# Patient Record
Sex: Female | Born: 1935 | Race: White | Hispanic: No | Smoking: Never smoker
Health system: Southern US, Community
[De-identification: ages and names within clinical notes are randomized; demographics above are authoritative.]

## PROBLEM LIST (undated history)

## (undated) ENCOUNTER — Ambulatory Visit (HOSPITAL_COMMUNITY): Discharge status: Absent without leave | Payer: 59

## (undated) DIAGNOSIS — I10 Essential (primary) hypertension: Secondary | ICD-10-CM

## (undated) DIAGNOSIS — D6851 Activated protein C resistance: Secondary | ICD-10-CM

## (undated) DIAGNOSIS — E119 Type 2 diabetes mellitus without complications: Secondary | ICD-10-CM

## (undated) DIAGNOSIS — E611 Iron deficiency: Secondary | ICD-10-CM

## (undated) DIAGNOSIS — N289 Disorder of kidney and ureter, unspecified: Secondary | ICD-10-CM

## (undated) HISTORY — PX: PARATHYROIDECTOMY / EXPLORATION OF PARATHYROIDS: SUR1002

## (undated) HISTORY — PX: STENT PLACEMENT VASCULAR (ARMC HX): HXRAD1737

## (undated) HISTORY — DX: Activated protein C resistance: D68.51

## (undated) HISTORY — DX: Iron deficiency: E61.1

---

## 2012-06-09 DIAGNOSIS — M8580 Other specified disorders of bone density and structure, unspecified site: Secondary | ICD-10-CM | POA: Insufficient documentation

## 2012-12-08 DIAGNOSIS — E890 Postprocedural hypothyroidism: Secondary | ICD-10-CM | POA: Insufficient documentation

## 2012-12-08 DIAGNOSIS — E892 Postprocedural hypoparathyroidism: Secondary | ICD-10-CM | POA: Insufficient documentation

## 2012-12-08 DIAGNOSIS — Z9889 Other specified postprocedural states: Secondary | ICD-10-CM | POA: Insufficient documentation

## 2013-04-26 DIAGNOSIS — E892 Postprocedural hypoparathyroidism: Secondary | ICD-10-CM | POA: Insufficient documentation

## 2015-11-30 DIAGNOSIS — N183 Chronic kidney disease, stage 3 unspecified: Secondary | ICD-10-CM | POA: Insufficient documentation

## 2016-04-25 DIAGNOSIS — E119 Type 2 diabetes mellitus without complications: Secondary | ICD-10-CM | POA: Insufficient documentation

## 2016-05-07 ENCOUNTER — Encounter (HOSPITAL_COMMUNITY): Payer: Self-pay

## 2016-05-07 ENCOUNTER — Emergency Department (HOSPITAL_COMMUNITY)
Admission: EM | Admit: 2016-05-07 | Discharge: 2016-05-07 | Disposition: A | Discharge status: Home / Self Care | Payer: Medicare Other | Attending: Emergency Medicine | Admitting: Emergency Medicine

## 2016-05-07 ENCOUNTER — Emergency Department (HOSPITAL_COMMUNITY): Payer: Medicare Other

## 2016-05-07 DIAGNOSIS — S51812A Laceration without foreign body of left forearm, initial encounter: Secondary | ICD-10-CM | POA: Insufficient documentation

## 2016-05-07 DIAGNOSIS — Y999 Unspecified external cause status: Secondary | ICD-10-CM | POA: Diagnosis not present

## 2016-05-07 DIAGNOSIS — N189 Chronic kidney disease, unspecified: Secondary | ICD-10-CM | POA: Diagnosis not present

## 2016-05-07 DIAGNOSIS — I129 Hypertensive chronic kidney disease with stage 1 through stage 4 chronic kidney disease, or unspecified chronic kidney disease: Secondary | ICD-10-CM | POA: Insufficient documentation

## 2016-05-07 DIAGNOSIS — S299XXA Unspecified injury of thorax, initial encounter: Secondary | ICD-10-CM | POA: Diagnosis present

## 2016-05-07 DIAGNOSIS — M542 Cervicalgia: Secondary | ICD-10-CM | POA: Diagnosis not present

## 2016-05-07 DIAGNOSIS — D649 Anemia, unspecified: Secondary | ICD-10-CM

## 2016-05-07 DIAGNOSIS — Z79899 Other long term (current) drug therapy: Secondary | ICD-10-CM | POA: Diagnosis not present

## 2016-05-07 DIAGNOSIS — S61412A Laceration without foreign body of left hand, initial encounter: Secondary | ICD-10-CM | POA: Insufficient documentation

## 2016-05-07 DIAGNOSIS — S2232XA Fracture of one rib, left side, initial encounter for closed fracture: Secondary | ICD-10-CM

## 2016-05-07 DIAGNOSIS — Y939 Activity, unspecified: Secondary | ICD-10-CM | POA: Diagnosis not present

## 2016-05-07 DIAGNOSIS — Z87898 Personal history of other specified conditions: Secondary | ICD-10-CM

## 2016-05-07 DIAGNOSIS — Y929 Unspecified place or not applicable: Secondary | ICD-10-CM | POA: Diagnosis not present

## 2016-05-07 DIAGNOSIS — S51811A Laceration without foreign body of right forearm, initial encounter: Secondary | ICD-10-CM | POA: Insufficient documentation

## 2016-05-07 DIAGNOSIS — W19XXXA Unspecified fall, initial encounter: Secondary | ICD-10-CM

## 2016-05-07 DIAGNOSIS — Z7984 Long term (current) use of oral hypoglycemic drugs: Secondary | ICD-10-CM | POA: Diagnosis not present

## 2016-05-07 DIAGNOSIS — E1122 Type 2 diabetes mellitus with diabetic chronic kidney disease: Secondary | ICD-10-CM | POA: Diagnosis not present

## 2016-05-07 DIAGNOSIS — W100XXA Fall (on)(from) escalator, initial encounter: Secondary | ICD-10-CM | POA: Insufficient documentation

## 2016-05-07 DIAGNOSIS — N289 Disorder of kidney and ureter, unspecified: Secondary | ICD-10-CM

## 2016-05-07 DIAGNOSIS — R195 Other fecal abnormalities: Secondary | ICD-10-CM | POA: Diagnosis not present

## 2016-05-07 HISTORY — DX: Disorder of kidney and ureter, unspecified: N28.9

## 2016-05-07 HISTORY — DX: Essential (primary) hypertension: I10

## 2016-05-07 HISTORY — DX: Type 2 diabetes mellitus without complications: E11.9

## 2016-05-07 LAB — COMPREHENSIVE METABOLIC PANEL
ALBUMIN: 3.3 g/dL — AB (ref 3.5–5.0)
ALK PHOS: 71 U/L (ref 38–126)
ALT: 10 U/L — ABNORMAL LOW (ref 14–54)
AST: 15 U/L (ref 15–41)
Anion gap: 8 (ref 5–15)
BILIRUBIN TOTAL: 0.8 mg/dL (ref 0.3–1.2)
BUN: 24 mg/dL — ABNORMAL HIGH (ref 6–20)
CALCIUM: 8.9 mg/dL (ref 8.9–10.3)
CO2: 23 mmol/L (ref 22–32)
CREATININE: 1.36 mg/dL — AB (ref 0.44–1.00)
Chloride: 109 mmol/L (ref 101–111)
GFR, EST AFRICAN AMERICAN: 40 mL/min — AB (ref >60)
GFR, EST NON AFRICAN AMERICAN: 35 mL/min — AB (ref >60)
Glucose, Bld: 131 mg/dL — ABNORMAL HIGH (ref 65–99)
Potassium: 4.2 mmol/L (ref 3.5–5.1)
Sodium: 140 mmol/L (ref 135–145)
TOTAL PROTEIN: 7.5 g/dL (ref 6.5–8.1)

## 2016-05-07 LAB — URINALYSIS, ROUTINE W REFLEX MICROSCOPIC
BILIRUBIN URINE: NEGATIVE
Glucose, UA: NEGATIVE mg/dL
Hgb urine dipstick: NEGATIVE
KETONES UR: NEGATIVE mg/dL
Leukocytes, UA: NEGATIVE
NITRITE: NEGATIVE
Protein, ur: NEGATIVE mg/dL
SPECIFIC GRAVITY, URINE: 1.025 (ref 1.005–1.030)
pH: 5.5 (ref 5.0–8.0)

## 2016-05-07 LAB — CBC WITH DIFFERENTIAL/PLATELET
BASOS PCT: 0 %
Basophils Absolute: 0 10*3/uL (ref 0.0–0.1)
EOS ABS: 0 10*3/uL (ref 0.0–0.7)
Eosinophils Relative: 1 %
HCT: 26.8 % — ABNORMAL LOW (ref 36.0–46.0)
HEMOGLOBIN: 8.2 g/dL — AB (ref 12.0–15.0)
Lymphocytes Relative: 10 %
Lymphs Abs: 0.8 10*3/uL (ref 0.7–4.0)
MCH: 24.3 pg — ABNORMAL LOW (ref 26.0–34.0)
MCHC: 30.6 g/dL (ref 30.0–36.0)
MCV: 79.3 fL (ref 78.0–100.0)
MONOS PCT: 10 %
Monocytes Absolute: 0.8 10*3/uL (ref 0.1–1.0)
NEUTROS PCT: 79 %
Neutro Abs: 6.5 10*3/uL (ref 1.7–7.7)
Platelets: 321 10*3/uL (ref 150–400)
RBC: 3.38 MIL/uL — ABNORMAL LOW (ref 3.87–5.11)
RDW: 16.1 % — ABNORMAL HIGH (ref 11.5–15.5)
WBC: 8.1 10*3/uL (ref 4.0–10.5)

## 2016-05-07 LAB — POC OCCULT BLOOD, ED: FECAL OCCULT BLD: POSITIVE — AB

## 2016-05-07 MED ORDER — FERROUS SULFATE 325 (65 FE) MG PO TABS: 325.0000 mg | ORAL_TABLET | Freq: Two times a day (BID) | ORAL | 0 refills | Status: AC

## 2016-05-07 NOTE — ED Triage Notes (Addendum)
Patient reports of falling down escalator 1 weeks ago from top to bottom. Complains of left hip/lef rib, neck and back pain. Family reports of recent fevers. Denies loc. Patient is on Plavix. Was not seen after fall per patient. Pt a&ox4.

## 2016-05-07 NOTE — ED Provider Notes (Signed)
[facility] DEPT Provider Note   CSN: [id number] Arrival date & time: 05/07/16  1243  By signing my name below, I, Placido SouLogan Joldersma, attest that this documentation has been prepared under the direction and in the presence of Jacalyn LefevreJulie Kalijah Westfall, MD. Electronically Signed: Placido SouLogan Joldersma, ED Scribe. 05/07/16. 1:52 PM.   History   Chief Complaint Chief Complaint  Patient presents with  . Fall    HPI HPI Comments: Vickie Willis is a 82 y.o. female who presents to the Emergency Department complaining of a fall that occurred 1 week ago. Pt states that she tripped on an escalator and fell down the stairs to the floor below. Pt reports associated worsening pain to her left ribs, left hip, lower back, feet and neck and and multiple healing skin tears to her bilateral forearms. Pt also complains of mild SOB which worsens with movement. She confirms she has been ambulatory since the accident. Her relative states that she began experiencing a fever two days ago (TMAX 101 last night and 98.5 in triage) and this in addition to her pain is the cause of their concern. Pt is on Plavix. She denies abd pain and dysuria.    The history is provided by the patient and a relative. No language interpreter was used.    Past Medical History:  Diagnosis Date  . Diabetes mellitus without complication (HCC)   . Hypertension   . Renal disorder     There are no active problems to display for this patient.   Past Surgical History:  Procedure Laterality Date  . PARATHYROIDECTOMY / EXPLORATION OF PARATHYROIDS    . STENT PLACEMENT VASCULAR ([facility] HX)      OB History    No data available       Home Medications    Prior to Admission medications   Medication Sig Start Date End Date Taking? Authorizing Provider  calcitRIOL (ROCALTROL) 0.5 MCG capsule Take 0.5 mcg by mouth daily.   Yes Historical Provider, MD  cetirizine (ZYRTEC) 10 MG tablet Take 10 mg by mouth daily.   Yes Historical Provider, MD    clopidogrel (PLAVIX) 75 MG tablet Take 75 mg by mouth daily.   Yes Historical Provider, MD  Coconut Oil 1000 MG CAPS Take by mouth 2 (two) times daily.   Yes Historical Provider, MD  doxazosin (CARDURA) 1 MG tablet Take 1 mg by mouth daily.   Yes Historical Provider, MD  esomeprazole (NEXIUM) 40 MG capsule Take 40 mg by mouth daily.    Yes Historical Provider, MD  gabapentin (NEURONTIN) 100 MG capsule Take 100 mg by mouth 2 (two) times daily.   Yes Historical Provider, MD  Garlic 1000 MG CAPS Take 1,000 mg by mouth daily.    Yes Historical Provider, MD  levothyroxine (SYNTHROID, LEVOTHROID) 100 MCG tablet Take 100 mcg by mouth daily before breakfast.   Yes Historical Provider, MD  lisinopril (PRINIVIL,ZESTRIL) 20 MG tablet Take 20 mg by mouth daily.   Yes Historical Provider, MD  NIFEdipine (PROCARDIA XL/ADALAT-CC) 60 MG 24 hr tablet Take 60 mg by mouth daily.   Yes Historical Provider, MD  sertraline (ZOLOFT) 100 MG tablet Take 50 mg by mouth daily.   Yes Historical Provider, MD  Vitamin D, Ergocalciferol, (DRISDOL) 50000 units CAPS capsule Take 50,000 Units by mouth every 7 (seven) days. wednesday   Yes Historical Provider, MD  glimepiride (AMARYL) 1 MG tablet Take 1 mg by mouth daily as needed. High blood sugar 04/25/16   Historical Provider, MD  HYDROcodone-acetaminophen (  NORCO) 7.5-325 MG tablet Take 1 tablet by mouth every 4 (four) hours as needed. 03/24/16   Historical Provider, MD    Family History No family history on file.  Social History Social History  Substance Use Topics  . Smoking status: Never Smoker  . Smokeless tobacco: Never Used  . Alcohol use No     Allergies   Penicillins   Review of Systems Review of Systems  Constitutional: Positive for fever.  Respiratory: Positive for shortness of breath.   Gastrointestinal: Negative for abdominal pain.  Genitourinary: Negative for dysuria.  Musculoskeletal: Positive for arthralgias, back pain, myalgias and neck pain.   Skin: Positive for color change and wound.  All other systems reviewed and are negative.  Physical Exam Updated Vital Signs BP 146/73   Pulse 77   Temp 98.5 F (36.9 C) (Oral)   Resp 18   Ht 5\' 7"  (1.702 m)   Wt 193 lb (87.5 kg)   SpO2 99%   BMI 30.23 kg/m   Physical Exam  Constitutional: She is oriented to person, place, and time. She appears well-developed and well-nourished.  HENT:  Head: Normocephalic and atraumatic.  Eyes: EOM are normal.  Neck: Normal range of motion.  Cardiovascular: Normal rate.   Pulmonary/Chest: Effort normal. No respiratory distress.  Abdominal: Soft.  Genitourinary: Rectal exam shows guaiac positive stool.  Musculoskeletal: Normal range of motion. She exhibits tenderness.  TTP to the left ribs. No midline spine tenderness.   Neurological: She is alert and oriented to person, place, and time.  Skin: Skin is warm and dry.  Skin tears on bilateral forearms and left hand. Bruising to the left upper arm. Bruising and healing wounds to the left ribs.   Psychiatric: She has a normal mood and affect.  Nursing note and vitals reviewed.  ED Treatments / Results  Labs (all labs ordered are listed, but only abnormal results are displayed) Labs Reviewed  COMPREHENSIVE METABOLIC PANEL - Abnormal; Notable for the following:       Result Value   Glucose, Bld 131 (*)    BUN 24 (*)    Creatinine, Ser 1.36 (*)    Albumin 3.3 (*)    ALT 10 (*)    GFR calc non Af Amer 35 (*)    GFR calc Af Amer 40 (*)    All other components within normal limits  CBC WITH DIFFERENTIAL/PLATELET - Abnormal; Notable for the following:    RBC 3.38 (*)    Hemoglobin 8.2 (*)    HCT 26.8 (*)    MCH 24.3 (*)    RDW 16.1 (*)    All other components within normal limits  URINALYSIS, ROUTINE W REFLEX MICROSCOPIC (NOT AT [facility])  POC OCCULT BLOOD, ED    EKG  EKG Interpretation None       Radiology Dg Ribs Unilateral W/chest Left  Result Date: 05/07/2016 CLINICAL DATA:   Status post fall down S-2 later 10 days ago. Persistent anterior left lower rib discomfort. EXAM: LEFT RIBS AND CHEST - 3+ VIEW COMPARISON:  None in PACs FINDINGS: The lungs are well-expanded and clear. The heart and pulmonary vascularity are normal. The mediastinum is normal in width. There is gentle dextrocurvature centered in the mid thoracic spine. There is calcification in the wall of the aortic arch. The patient has sustained an acute fracture of the lateral aspect of the left 6th rib. There degenerative changes of the AC joints. IMPRESSION: Acute lateral left sixth rib fracture. No pneumothorax, pleural effusion,  or pulmonary contusion. Aortic atherosclerosis. Electronically Signed   By: David  [place] M.D.   On: 05/07/2016 13:37   Dg Hip Unilat With Pelvis 2-3 Views Left  Result Date: 05/07/2016 CLINICAL DATA:  Status post fall down S2 later 10 days ago. Persistent back and left hip discomfort. EXAM: DG HIP (WITH OR WITHOUT PELVIS) 2-3V LEFT COMPARISON:  None in PACs FINDINGS: The bones are subjectively osteopenic. The bony pelvis exhibits no acute abnormality. Probable old fracture of the inferior pubic ramus on the left is observed. There is mild symmetric narrowing of the left hip joint space. The femoral head, neck, intertrochanteric, and subtrochanteric regions are normal. IMPRESSION: No definite acute left hip fracture. Irregularity of the inferior pubic ramus consistent with previous fracture that has healed with deformity. No definite acute pelvic fracture is observed. If there are strong clinical concerns of an occult fracture, CT scanning or MRI would be useful next imaging steps. Electronically Signed   By: David  [place] M.D.   On: 05/07/2016 13:40    Procedures Procedures  DIAGNOSTIC STUDIES: Oxygen Saturation is 99% on RA, normal by my interpretation.    COORDINATION OF CARE: 1:48 PM Discussed next steps with pt. Pt verbalized understanding and is agreeable with the plan.     Medications Ordered in ED Medications - No data to display   Initial Impression / Assessment and Plan / ED Course  I have reviewed the triage vital signs and the nursing notes.  Pertinent labs & imaging results that were available during my care of the patient were reviewed by me and considered in my medical decision making (see chart for details).  Clinical Course   I personally performed the services described in this documentation, which was scribed in my presence. The recorded information has been reviewed and is accurate.  Pt's last hgb was 9 on 8/22 in [place] (pt is visiting her daughter here in [place]).    Because the pt's hemoccult was + and her hgb is lower than it was, I recommended admission.  Pt refuses this.   She knows to return if sx worsen.  She does not take iron at home, and will be started on it.  Pt has pain meds at home and does not want anything here or for home.  The pt is given an incentive spirometer for her rib fracture.  She had a fever at home, but no fever here and no sign of infection.  Final Clinical Impressions(s) / ED Diagnoses   Final diagnoses:  Fall, initial encounter  Rib fracture, left, closed, initial encounter  Anemia, unspecified anemia type  CRI (chronic renal insufficiency), unspecified stage  Guaiac positive stools  History of fever    New Prescriptions New Prescriptions   No medications on file     Jacalyn Lefevre, MD 05/07/16 [phone]

## 2016-06-09 ENCOUNTER — Ambulatory Visit: Payer: Self-pay | Admitting: Family Medicine

## 2016-06-09 ENCOUNTER — Ambulatory Visit: Payer: Medicare Other | Admitting: Family Medicine

## 2016-06-17 ENCOUNTER — Telehealth: Payer: Self-pay | Admitting: Family Medicine

## 2016-06-17 NOTE — Telephone Encounter (Signed)
Pt was advised that she should come in today for swollen, discolored finger. Pt states that she does not want to get out of the bed today and would rather an appointment for tomorrow. Told pt about the possibility of infection and she insisted on not coming in today.

## 2016-06-18 ENCOUNTER — Ambulatory Visit (INDEPENDENT_AMBULATORY_CARE_PROVIDER_SITE_OTHER): Payer: Medicare Other | Admitting: Family Medicine

## 2016-06-18 ENCOUNTER — Encounter: Payer: Self-pay | Admitting: Family Medicine

## 2016-06-18 VITALS — BP 104/70 | Ht 67.0 in | Wt 194.0 lb

## 2016-06-18 DIAGNOSIS — M109 Gout, unspecified: Secondary | ICD-10-CM | POA: Diagnosis not present

## 2016-06-18 DIAGNOSIS — M791 Myalgia, unspecified site: Secondary | ICD-10-CM

## 2016-06-18 DIAGNOSIS — M79644 Pain in right finger(s): Secondary | ICD-10-CM

## 2016-06-18 DIAGNOSIS — M2550 Pain in unspecified joint: Secondary | ICD-10-CM | POA: Diagnosis not present

## 2016-06-18 DIAGNOSIS — E119 Type 2 diabetes mellitus without complications: Secondary | ICD-10-CM | POA: Diagnosis not present

## 2016-06-18 MED ORDER — PREDNISONE 20 MG PO TABS: ORAL_TABLET | ORAL | 0 refills | Status: DC

## 2016-06-18 MED ORDER — COLCHICINE 0.6 MG PO TABS: 0.6000 mg | ORAL_TABLET | Freq: Two times a day (BID) | ORAL | 1 refills | Status: DC

## 2016-06-18 NOTE — Progress Notes (Signed)
   Subjective:    Patient ID: Vickie Willis, female    DOB: 24-Jun-1931, 82 y.o.   MRN: [id number]  HPI Patient in today for edema and pain to right hand. Onset 3 days ago. States has not tried any treatments.  This is a new patient comes in today having some problems. She lives in [place] but she presents here because she is staying with family. She fell and broke some ribs. Recently she describes pain in her index finger she also relates some stiffness and achiness in her joints as well as the muscles around her neck and also feels at times like she can't quite get up and move around as well as she used to she denies numbness tingling chills. Denies chest pressure tightness pain. Her past medical history was reviewed. Patient would also like to discuss antiinflammatory medications. Review of Systems Currently now denies chest tightness pressure pain shortness breath nausea vomiting diarrhea. She is treated for multiple medical illnesses these were reviewed with patient. She does have history of heart stents and is on Plavix.    Objective:   Physical Exam Lungs are clear no crackles chest wall tender where she had a fractured rib heart is regular. HEENT benign no obvious swelling in the joints is set for the index finger which is somewhat swollen slightly red and tender in the joints       Assessment & Plan:  Possible gout check uric acid level Possible polymyalgia rheumatica check lab work Short course of prednisone this will cause sugars to go up Diabetes continue current measures watch diet closely I do not find evidence of infection May need referral to rheumatology Wait the results of the tests complex patient 30 minutes spent with patient new patient-greater in half in discussion

## 2016-06-19 ENCOUNTER — Other Ambulatory Visit: Payer: Self-pay

## 2016-06-19 LAB — URIC ACID: URIC ACID: 5.3 mg/dL (ref 2.5–7.1)

## 2016-06-19 LAB — CBC WITH DIFFERENTIAL/PLATELET
BASOS ABS: 0.1 10*3/uL (ref 0.0–0.2)
BASOS: 1 %
EOS (ABSOLUTE): 0 10*3/uL (ref 0.0–0.4)
Eos: 0 %
HEMOGLOBIN: 8.6 g/dL — AB (ref 11.1–15.9)
Hematocrit: 27 % — ABNORMAL LOW (ref 34.0–46.6)
Immature Grans (Abs): 0.1 10*3/uL (ref 0.0–0.1)
Immature Granulocytes: 1 %
LYMPHS ABS: 1.1 10*3/uL (ref 0.7–3.1)
Lymphs: 10 %
MCH: 23.5 pg — AB (ref 26.6–33.0)
MCHC: 31.9 g/dL (ref 31.5–35.7)
MCV: 74 fL — AB (ref 79–97)
MONOCYTES: 9 %
Monocytes Absolute: 0.9 10*3/uL (ref 0.1–0.9)
NEUTROS ABS: 8.6 10*3/uL — AB (ref 1.4–7.0)
Neutrophils: 79 %
Platelets: 329 10*3/uL (ref 150–379)
RBC: 3.66 x10E6/uL — ABNORMAL LOW (ref 3.77–5.28)
RDW: 17.5 % — ABNORMAL HIGH (ref 12.3–15.4)
WBC: 10.7 10*3/uL (ref 3.4–10.8)

## 2016-06-19 LAB — HEMOGLOBIN A1C
Est. average glucose Bld gHb Est-mCnc: 140 mg/dL
Hgb A1c MFr Bld: 6.5 % — ABNORMAL HIGH (ref 4.8–5.6)

## 2016-06-19 LAB — SEDIMENTATION RATE: SED RATE: 71 mm/h — AB (ref 0–40)

## 2016-06-19 LAB — C-REACTIVE PROTEIN: CRP: 114.3 mg/L — AB (ref 0.0–4.9)

## 2016-06-19 MED ORDER — DOXYCYCLINE HYCLATE 100 MG PO TABS: 100.0000 mg | ORAL_TABLET | Freq: Two times a day (BID) | ORAL | 0 refills | Status: DC

## 2016-06-20 LAB — SPECIMEN STATUS REPORT

## 2016-06-24 ENCOUNTER — Other Ambulatory Visit: Payer: Self-pay

## 2016-06-24 DIAGNOSIS — D649 Anemia, unspecified: Secondary | ICD-10-CM

## 2016-06-26 ENCOUNTER — Ambulatory Visit (INDEPENDENT_AMBULATORY_CARE_PROVIDER_SITE_OTHER): Payer: Medicare Other | Admitting: Family Medicine

## 2016-06-26 ENCOUNTER — Encounter: Payer: Self-pay | Admitting: Family Medicine

## 2016-06-26 VITALS — BP 128/70 | Ht 67.0 in | Wt 191.4 lb

## 2016-06-26 DIAGNOSIS — D509 Iron deficiency anemia, unspecified: Secondary | ICD-10-CM

## 2016-06-26 DIAGNOSIS — M2550 Pain in unspecified joint: Secondary | ICD-10-CM | POA: Diagnosis not present

## 2016-06-26 LAB — IRON AND TIBC
IRON SATURATION: 5 % — AB (ref 15–55)
Iron: 16 ug/dL — ABNORMAL LOW (ref 27–139)
Total Iron Binding Capacity: 321 ug/dL (ref 250–450)
UIBC: 305 ug/dL (ref 118–369)

## 2016-06-26 LAB — SPECIMEN STATUS REPORT

## 2016-06-26 LAB — FERRITIN: FERRITIN: 48 ng/mL (ref 15–150)

## 2016-06-26 NOTE — Progress Notes (Signed)
   Subjective:    Patient ID: Vickie Willis, female    DOB: 1931/07/16, 82 y.o.   MRN: [id number]  HPI Patient is here today for a follow up from her last visit. Patient has recent bloodwork done and her iron levels are very low. Patient has no concerns at this time.  Long discussion held today with the patient regarding her anemia she has not seen any blood in her stools recently. She is had a thorough workup from her doctor in [place] who is done both colonoscopy endoscopy capsule study. And follows her blood work. The specialist thinks it's related to her diverticula. The patient states her finger is doing much better is no longer red or swollen. Patient's recent lab work showed elevation of C-reactive protein and sedimentation rate. Patient states she is had her arthritis addressed by her specialist but was told there really wasn't much she could take because of her kidney function Review of Systems  Constitutional: Negative for activity change, fatigue and fever.  Respiratory: Negative for cough and shortness of breath.   Cardiovascular: Negative for chest pain and leg swelling.  Neurological: Negative for headaches.       Objective:   Physical Exam  Constitutional: She appears well-nourished. No distress.  Cardiovascular: Normal rate, regular rhythm and normal heart sounds.   No murmur heard. Pulmonary/Chest: Effort normal and breath sounds normal. No respiratory distress.  Musculoskeletal: She exhibits no edema.  Lymphadenopathy:    She has no cervical adenopathy.  Neurological: She is alert. She exhibits normal muscle tone.  Psychiatric: Her behavior is normal.  Vitals reviewed.   Patient will do Hemoccult cards      Assessment & Plan:  Resolved finger infection Significant arthritis follow-up with specialist in [place] Significant iron deficient anemia-follow-up with specialists in [place]. Consider iron infusion. Repeat CBC in 3-4 weeks.

## 2016-06-30 ENCOUNTER — Encounter: Payer: Self-pay | Admitting: Family Medicine

## 2016-06-30 ENCOUNTER — Telehealth: Payer: Self-pay | Admitting: Family Medicine

## 2016-06-30 DIAGNOSIS — D509 Iron deficiency anemia, unspecified: Secondary | ICD-10-CM

## 2016-06-30 DIAGNOSIS — M353 Polymyalgia rheumatica: Secondary | ICD-10-CM

## 2016-06-30 MED ORDER — PREDNISONE 10 MG PO TABS: ORAL_TABLET | ORAL | 0 refills | Status: AC

## 2016-06-30 NOTE — Telephone Encounter (Signed)
Requesting a nurse to call her back.  Vickie Willis originally came in here for her finger swelling.  She finished the medication and when she came in last Thursday she was feeling fine.  Now, she woke up this morning and she cant hardly walk and her finger is swollen again.  Daughter is wanting to know if we can call in another round of medication?

## 2016-06-30 NOTE — Telephone Encounter (Signed)
Notified daughter that patient is having polymyalgia rheumatica which is a autoimmune illness that may well need ongoing prednisone. Recommend prednisone 10 mg tablets, take 4 daily for the next 4 days then 3 daily for 4 days then 2 a day for 4 days then 1 a day for 4 days. Dr. Lorin PicketScott also believes patient would benefit from seeing a rheumatologist. [place]. Med sent to pharmacy. Daughter will discuss referral with patient and if she agrees they will call back to have that initiated. Finger is just swollen not red or tender per daughter.

## 2016-06-30 NOTE — Telephone Encounter (Signed)
I believe she is having polymyalgia rheumatica which is a autoimmune illness that may well need ongoing prednisone. I recommend prednisone 10 mg tablets, take 4 daily for the next 4 days then 3 daily for 4 days then 2 a day for 4 days then 1 a day for 4 days. I also believe patient would benefit from seeing a rheumatologist. We could set [place] up with [place] specialist in [place] if family willing. Also inquire with family is the finger just more swollen like it was last time or is it red and tender?

## 2016-06-30 NOTE — Telephone Encounter (Signed)
Daughter Almira Coaster(Gina) states that patient's finger is swollen again, she is having a hard time getting around and having muscle aches. No other symptoms. Wants to know does she need another round of medication?

## 2016-07-01 NOTE — Telephone Encounter (Signed)
Please give referral to Dr. Galen ManilaPenland for iron deficient anemia.

## 2016-07-08 ENCOUNTER — Encounter: Payer: Self-pay | Admitting: Family Medicine

## 2016-07-12 ENCOUNTER — Emergency Department (HOSPITAL_COMMUNITY)
Admission: EM | Admit: 2016-07-12 | Discharge: 2016-07-12 | Disposition: A | Discharge status: Home / Self Care | Payer: Medicare Other | Attending: Emergency Medicine | Admitting: Emergency Medicine

## 2016-07-12 ENCOUNTER — Encounter (HOSPITAL_COMMUNITY): Payer: Self-pay | Admitting: *Deleted

## 2016-07-12 ENCOUNTER — Ambulatory Visit (HOSPITAL_COMMUNITY)
Admission: EM | Admit: 2016-07-12 | Discharge: 2016-07-12 | Disposition: A | Discharge status: Home / Self Care | Payer: Medicare Other | Attending: Emergency Medicine | Admitting: Emergency Medicine

## 2016-07-12 ENCOUNTER — Ambulatory Visit (INDEPENDENT_AMBULATORY_CARE_PROVIDER_SITE_OTHER): Payer: Medicare Other

## 2016-07-12 ENCOUNTER — Encounter (HOSPITAL_COMMUNITY): Payer: Self-pay | Admitting: Emergency Medicine

## 2016-07-12 DIAGNOSIS — I509 Heart failure, unspecified: Secondary | ICD-10-CM | POA: Insufficient documentation

## 2016-07-12 DIAGNOSIS — J189 Pneumonia, unspecified organism: Secondary | ICD-10-CM

## 2016-07-12 DIAGNOSIS — R0600 Dyspnea, unspecified: Secondary | ICD-10-CM

## 2016-07-12 DIAGNOSIS — I110 Hypertensive heart disease with heart failure: Secondary | ICD-10-CM | POA: Diagnosis not present

## 2016-07-12 DIAGNOSIS — I4891 Unspecified atrial fibrillation: Secondary | ICD-10-CM

## 2016-07-12 DIAGNOSIS — R0602 Shortness of breath: Secondary | ICD-10-CM | POA: Diagnosis present

## 2016-07-12 DIAGNOSIS — Z79899 Other long term (current) drug therapy: Secondary | ICD-10-CM | POA: Diagnosis not present

## 2016-07-12 LAB — CBC
HEMATOCRIT: 29.5 % — AB (ref 36.0–46.0)
HEMOGLOBIN: 8.7 g/dL — AB (ref 12.0–15.0)
MCH: 23.1 pg — AB (ref 26.0–34.0)
MCHC: 29.5 g/dL — AB (ref 30.0–36.0)
MCV: 78.2 fL (ref 78.0–100.0)
Platelets: 174 10*3/uL (ref 150–400)
RBC: 3.77 MIL/uL — ABNORMAL LOW (ref 3.87–5.11)
RDW: 21 % — AB (ref 11.5–15.5)
WBC: 10.6 10*3/uL — ABNORMAL HIGH (ref 4.0–10.5)

## 2016-07-12 LAB — I-STAT TROPONIN, ED: TROPONIN I, POC: 0.01 ng/mL (ref 0.00–0.08)

## 2016-07-12 LAB — BASIC METABOLIC PANEL
ANION GAP: 9 (ref 5–15)
BUN: 20 mg/dL (ref 6–20)
CALCIUM: 7.7 mg/dL — AB (ref 8.9–10.3)
CO2: 25 mmol/L (ref 22–32)
Chloride: 108 mmol/L (ref 101–111)
Creatinine, Ser: 1.54 mg/dL — ABNORMAL HIGH (ref 0.44–1.00)
GFR calc Af Amer: 35 mL/min — ABNORMAL LOW (ref >60)
GFR calc non Af Amer: 30 mL/min — ABNORMAL LOW (ref >60)
GLUCOSE: 185 mg/dL — AB (ref 65–99)
Potassium: 4.1 mmol/L (ref 3.5–5.1)
Sodium: 142 mmol/L (ref 135–145)

## 2016-07-12 LAB — BRAIN NATRIURETIC PEPTIDE: B Natriuretic Peptide: 472.2 pg/mL — ABNORMAL HIGH (ref 0.0–100.0)

## 2016-07-12 MED ORDER — ALBUTEROL SULFATE HFA 108 (90 BASE) MCG/ACT IN AERS
2.0000 | INHALATION_SPRAY | Freq: Once | RESPIRATORY_TRACT | Status: AC
  Administered 2016-07-12: 2 via RESPIRATORY_TRACT
  Filled 2016-07-12: qty 6.7

## 2016-07-12 MED ORDER — BENZONATATE 100 MG PO CAPS: 100.0000 mg | ORAL_CAPSULE | Freq: Three times a day (TID) | ORAL | 0 refills | Status: AC

## 2016-07-12 MED ORDER — LEVOFLOXACIN 750 MG PO TABS: 750.0000 mg | ORAL_TABLET | Freq: Every day | ORAL | 0 refills | Status: DC

## 2016-07-12 MED ORDER — IPRATROPIUM-ALBUTEROL 0.5-2.5 (3) MG/3ML IN SOLN
3.0000 mL | RESPIRATORY_TRACT | Status: DC
  Administered 2016-07-12: 3 mL via RESPIRATORY_TRACT
  Filled 2016-07-12: qty 3

## 2016-07-12 MED ORDER — LEVOFLOXACIN 500 MG PO TABS
500.0000 mg | ORAL_TABLET | Freq: Once | ORAL | Status: AC
  Administered 2016-07-12: 500 mg via ORAL
  Filled 2016-07-12: qty 1

## 2016-07-12 NOTE — ED Triage Notes (Signed)
Pt went to [facility] today for squeezing cp x 3 days with cough and sob. Sent here for atrial fib, denies hx of same. ekg done on arrival and no acute distress noted.

## 2016-07-12 NOTE — ED Provider Notes (Signed)
[facility]    CSN: [id number] Arrival date & time: 07/12/16  1201     History   Chief Complaint Chief Complaint  Patient presents with  . Cough    HPI Vickie Willis is a 82 y.o. female.   HPI  A 82-year-old woman here for chest tightness and wheezing. Symptoms started 3 days ago and have gradually been worsening. She reports feeling like she is wheezing at her chest is tight. She has had some heartburn type symptoms the last few days as well. She does report a mild cough. She describes sinus drainage. No fevers or chills. Appetite is normal.  She does report increased leg swelling over the last several days.  She has a history of a rib fracture the end of September. She states she still has some pain from this, but it is much improved from prior.  She also has a history of cardiac stenting for which she takes Plavix.  Past Medical History:  Diagnosis Date  . Diabetes mellitus without complication (HCC)   . Hypertension   . Renal disorder     There are no active problems to display for this patient.   Past Surgical History:  Procedure Laterality Date  . PARATHYROIDECTOMY / EXPLORATION OF PARATHYROIDS    . STENT PLACEMENT VASCULAR ([facility] HX)      OB History    No data available       Home Medications    Prior to Admission medications   Medication Sig Start Date End Date Taking? Authorizing Provider  calcitRIOL (ROCALTROL) 0.5 MCG capsule Take 0.5 mcg by mouth daily.   Yes Historical Provider, MD  cetirizine (ZYRTEC) 10 MG tablet Take 10 mg by mouth daily.   Yes Historical Provider, MD  clopidogrel (PLAVIX) 75 MG tablet Take 75 mg by mouth daily.   Yes Historical Provider, MD  Coconut Oil 1000 MG CAPS Take by mouth 2 (two) times daily.   Yes Historical Provider, MD  colchicine 0.6 MG tablet Take 1 tablet (0.6 mg total) by mouth 2 (two) times daily. Until finger pain relief. 06/18/16  Yes Babs SciaraScott A Luking, MD  doxazosin (CARDURA) 1 MG tablet Take 1 mg by  mouth daily.   Yes Historical Provider, MD  doxycycline (VIBRA-TABS) 100 MG tablet Take 1 tablet (100 mg total) by mouth 2 (two) times daily. X 7 days 06/19/16  Yes Babs SciaraScott A Luking, MD  esomeprazole (NEXIUM) 40 MG capsule Take 40 mg by mouth daily.    Yes Historical Provider, MD  ferrous sulfate 325 (65 FE) MG tablet Take 1 tablet (325 mg total) by mouth 2 (two) times daily with a meal. 05/07/16  Yes Jacalyn LefevreJulie Haviland, MD  gabapentin (NEURONTIN) 100 MG capsule Take 100 mg by mouth 2 (two) times daily.   Yes Historical Provider, MD  Garlic 1000 MG CAPS Take 1,000 mg by mouth daily.    Yes Historical Provider, MD  glimepiride (AMARYL) 1 MG tablet Take 1 mg by mouth daily as needed. High blood sugar 04/25/16  Yes Historical Provider, MD  HYDROcodone-acetaminophen (NORCO) 7.5-325 MG tablet Take 1 tablet by mouth every 4 (four) hours as needed. 03/24/16  Yes Historical Provider, MD  levothyroxine (SYNTHROID, LEVOTHROID) 100 MCG tablet Take 100 mcg by mouth daily before breakfast.   Yes Historical Provider, MD  lisinopril (PRINIVIL,ZESTRIL) 20 MG tablet Take 20 mg by mouth daily.   Yes Historical Provider, MD  NIFEdipine (PROCARDIA XL/ADALAT-CC) 60 MG 24 hr tablet Take 60 mg by mouth daily.  Yes Historical Provider, MD  Omega-3 Fatty Acids (FISH OIL) 1200 MG CAPS Take by mouth 2 (two) times daily at 10 AM and 5 PM.   Yes Historical Provider, MD  predniSONE (DELTASONE) 10 MG tablet take 4 daily for the next 4 days then 3 daily for 4 days then 2 a day for 4 days then 1 a day for 4 days. 06/30/16  Yes Babs Sciara, MD  sertraline (ZOLOFT) 100 MG tablet Take 50 mg by mouth daily.   Yes Historical Provider, MD  Vitamin D, Ergocalciferol, (DRISDOL) 50000 units CAPS capsule Take 50,000 Units by mouth every 7 (seven) days. wednesday   Yes Historical Provider, MD    Family History History reviewed. No pertinent family history.  Social History Social History  Substance Use Topics  . Smoking status: Never Smoker    . Smokeless tobacco: Never Used  . Alcohol use No     Allergies   Penicillins   Review of Systems Review of Systems As in history of present illness  Physical Exam Triage Vital Signs ED Triage Vitals  Enc Vitals Group     BP      Pulse      Resp      Temp      Temp src      SpO2      Weight      Height      Head Circumference      Peak Flow      Pain Score      Pain Loc      Pain Edu?      Excl. in GC?    No data found.   Updated Vital Signs BP 149/79 (BP Location: Right Arm)   Pulse 80   Temp 98.5 F (36.9 C) (Oral)   Resp 16   SpO2 99%   Visual Acuity Right Eye Distance:   Left Eye Distance:   Bilateral Distance:    Right Eye Near:   Left Eye Near:    Bilateral Near:     Physical Exam  Constitutional: She is oriented to person, place, and time. She appears well-developed and well-nourished. No distress.  HENT:  Nose: Nose normal.  Mouth/Throat: Oropharynx is clear and moist. No oropharyngeal exudate.  TMs normal bilaterally  Neck: Neck supple.  Cardiovascular: Normal rate, regular rhythm and normal heart sounds.   No murmur heard. Pulmonary/Chest: Effort normal. No respiratory distress. She has no wheezes. She has no rales.  Decreased air movement and left lung base. She does have a slight wheeze with cough.  Musculoskeletal: She exhibits edema (2+ bilaterally). She exhibits no tenderness.  Lymphadenopathy:    She has no cervical adenopathy.  Neurological: She is alert and oriented to person, place, and time.     UC Treatments / Results  Labs (all labs ordered are listed, but only abnormal results are displayed) Labs Reviewed - No data to display  EKG  EKG Interpretation None       Radiology Dg Chest 2 View  Result Date: 07/12/2016 CLINICAL DATA:  Tightness in chest.  Wheezing. EXAM: CHEST  2 VIEW COMPARISON:  May 07, 2016 FINDINGS: Callus formation at the left lateral rib fracture seen on the previous study. Degenerative  changes in the spine. No pneumothorax. Small nodule measuring 5 mm in the right base, probably unchanged given difference in positioning. Interstitial coarsening in the bases with more focal opacity on the left, probably in the lingula based on the lateral view.  No other acute abnormalities identified. IMPRESSION: 1. 5 mm nodule in the right lung. Recommend comparison to remote outside films if available. Otherwise, recommend CT scan. 2. Bronchitic changes in the bases. More focal opacity in the lingula may represent developing pneumonia. Recommend follow-up to resolution. Electronically Signed   By: Gerome Samavid  Williams III M.D   On: 07/12/2016 13:10    Procedures ED EKG Date/Time: 07/12/2016 1:32 PM Performed by: Charm RingsHONIG, Marcus Groll J Authorized by: Charm RingsHONIG, Harol Shabazz J   ECG reviewed by ED Physician in the absence of a cardiologist: yes   Previous ECG:    Previous ECG:  Unavailable Interpretation:    Interpretation: abnormal   Rate:    ECG rate:  72   ECG rate assessment: normal   Rhythm:    Rhythm: atrial fibrillation   Ectopy:    Ectopy: none   QRS:    QRS axis:  Normal Conduction:    Conduction: normal   ST segments:    ST segments:  Normal T waves:    T waves: normal   Comments:     New onset A. fib.   (including critical care time)  Medications Ordered in UC Medications - No data to display   Initial Impression / Assessment and Plan / UC Course  I have reviewed the triage vital signs and the nursing notes.  Pertinent labs & imaging results that were available during my care of the patient were reviewed by me and considered in my medical decision making (see chart for details).  Clinical Course     I had an extensive discussion with the patient and her daughter regarding management of atrial fibrillation. I recommended transfer to the emergency room with likely admission for new onset atrial fibrillation. Patient is very reluctant to follow this plan. I discussed that atrial fib puts  her at high risk for stroke. She needs to be evaluated by a cardiologist today and receive at least an echocardiogram to evaluate heart function. Discussed that they may need to put her back into sinus rhythm versus starting anticoagulation for stroke prevention. Patient did eventually agree for transfer to the emergency room. Given stable vital signs, will allow daughter to transport her.  X-ray read is also concerning for developing pneumonia, which could be a trigger for atrial fibrillation.  Final Clinical Impressions(s) / UC Diagnoses   Final diagnoses:  Atrial fibrillation, unspecified type [facility](HCC)    New Prescriptions Discharge Medication List as of 07/12/2016  1:25 PM       Charm RingsErin J Thanos Cousineau, MD 07/12/16 1334

## 2016-07-12 NOTE — ED Provider Notes (Signed)
[facility]   CSN: [id number] Arrival date & time: 07/12/16  1333     History   Chief Complaint Chief Complaint  Patient presents with  . Shortness of Breath    HPI Vickie Willis is a 82 y.o. female.  82 year old female here from urgent care secondary to concerns for atrial fibrillation. Patient states that over the last 3-4 days she developed rhinorrhea, congestion and postnasal drip. Coughing and progressively got worse and she became more weak. She went to urgent care where initial EKG is concerning for atrial fibrillation. She also related that she had a bilateral lower extremity edema however this is not new, as she actually takes HCTZ PRN for it at home. She has had a little bit of chest tightness but is more related to the shortness of breath and wheezing than it is pressure.       Past Medical History:  Diagnosis Date  . Diabetes mellitus without complication (HCC)   . Hypertension   . Renal disorder     There are no active problems to display for this patient.   Past Surgical History:  Procedure Laterality Date  . PARATHYROIDECTOMY / EXPLORATION OF PARATHYROIDS    . STENT PLACEMENT VASCULAR ([facility] HX)      OB History    No data available       Home Medications    Prior to Admission medications   Medication Sig Start Date End Date Taking? Authorizing Provider  calcitRIOL (ROCALTROL) 0.5 MCG capsule Take 0.5 mcg by mouth daily.   Yes Historical Provider, MD  cetirizine (ZYRTEC) 10 MG tablet Take 10 mg by mouth daily.   Yes Historical Provider, MD  clopidogrel (PLAVIX) 75 MG tablet Take 75 mg by mouth daily.   Yes Historical Provider, MD  Coconut Oil 1000 MG CAPS Take by mouth 2 (two) times daily.   Yes Historical Provider, MD  doxazosin (CARDURA) 1 MG tablet Take 1 mg by mouth daily.   Yes Historical Provider, MD  esomeprazole (NEXIUM) 40 MG capsule Take 40 mg by mouth daily.    Yes Historical Provider, MD  ferrous sulfate 325 (65  FE) MG tablet Take 1 tablet (325 mg total) by mouth 2 (two) times daily with a meal. 05/07/16  Yes Jacalyn LefevreJulie Haviland, MD  gabapentin (NEURONTIN) 100 MG capsule Take 100 mg by mouth 2 (two) times daily.   Yes Historical Provider, MD  Garlic 1000 MG CAPS Take 1,000 mg by mouth daily.    Yes Historical Provider, MD  glimepiride (AMARYL) 1 MG tablet Take 1 mg by mouth daily as needed. High blood sugar 04/25/16  Yes Historical Provider, MD  HYDROcodone-acetaminophen (NORCO) 7.5-325 MG tablet Take 1 tablet by mouth every 4 (four) hours as needed. 03/24/16  Yes Historical Provider, MD  levothyroxine (SYNTHROID, LEVOTHROID) 100 MCG tablet Take 100 mcg by mouth daily before breakfast.   Yes Historical Provider, MD  lisinopril (PRINIVIL,ZESTRIL) 20 MG tablet Take 20 mg by mouth daily.   Yes Historical Provider, MD  NIFEdipine (PROCARDIA XL/ADALAT-CC) 60 MG 24 hr tablet Take 60 mg by mouth daily.   Yes Historical Provider, MD  Omega-3 Fatty Acids (FISH OIL) 1200 MG CAPS Take by mouth 2 (two) times daily at 10 AM and 5 PM.   Yes Historical Provider, MD  predniSONE (DELTASONE) 10 MG tablet take 4 daily for the next 4 days then 3 daily for 4 days then 2 a day for 4 days then 1 a day for 4 days.  06/30/16  Yes Babs Sciara, MD  sertraline (ZOLOFT) 100 MG tablet Take 50 mg by mouth daily.   Yes Historical Provider, MD  Vitamin D, Ergocalciferol, (DRISDOL) 50000 units CAPS capsule Take 50,000 Units by mouth every 7 (seven) days. wednesday   Yes Historical Provider, MD  benzonatate (TESSALON) 100 MG capsule Take 1 capsule (100 mg total) by mouth every 8 (eight) hours. 07/12/16   Marily Memos, MD  doxycycline (VIBRA-TABS) 100 MG tablet Take 1 tablet (100 mg total) by mouth 2 (two) times daily. X 7 days Patient not taking: Reported on 07/12/2016 06/19/16   Babs Sciara, MD  levofloxacin (LEVAQUIN) 750 MG tablet Take 1 tablet (750 mg total) by mouth daily. X 7 days 07/12/16   Marily Memos, MD    Family History History  reviewed. No pertinent family history.  Social History Social History  Substance Use Topics  . Smoking status: Never Smoker  . Smokeless tobacco: Never Used  . Alcohol use No     Allergies   Penicillins   Review of Systems Review of Systems  All other systems reviewed and are negative.    Physical Exam Updated Vital Signs BP 134/63   Pulse 82   Temp 98.1 F (36.7 C) (Oral)   Resp 16   SpO2 98%   Physical Exam  Constitutional: She is oriented to person, place, and time. She appears well-developed and well-nourished.  HENT:  Head: Normocephalic and atraumatic.  Eyes: Conjunctivae and EOM are normal.  Neck: Normal range of motion.  Cardiovascular: Normal rate and regular rhythm.   No murmur heard. Pulmonary/Chest: No stridor. No respiratory distress. She has wheezes. She has no rales. She exhibits no tenderness.  Abdominal: Soft. She exhibits no distension. There is no tenderness.  Musculoskeletal: She exhibits edema (BLE, mild pitting to above ankles).  Neurological: She is alert and oriented to person, place, and time. No cranial nerve deficit. Coordination normal.  Skin: Skin is warm and dry.  Nursing note and vitals reviewed.    ED Treatments / Results  Labs (all labs ordered are listed, but only abnormal results are displayed) Labs Reviewed  BASIC METABOLIC PANEL - Abnormal; Notable for the following:       Result Value   Glucose, Bld 185 (*)    Creatinine, Ser 1.54 (*)    Calcium 7.7 (*)    GFR calc non Af Amer 30 (*)    GFR calc Af Amer 35 (*)    All other components within normal limits  CBC - Abnormal; Notable for the following:    WBC 10.6 (*)    RBC 3.77 (*)    Hemoglobin 8.7 (*)    HCT 29.5 (*)    MCH 23.1 (*)    MCHC 29.5 (*)    RDW 21.0 (*)    All other components within normal limits  BRAIN NATRIURETIC PEPTIDE - Abnormal; Notable for the following:    B Natriuretic Peptide 472.2 (*)    All other components within normal limits  I-STAT  TROPOININ, ED    EKG  EKG Interpretation  Date/Time:  Saturday July 12 2016 14:00:13 EST Ventricular Rate:  69 PR Interval:  114 QRS Duration: 92 QT Interval:  422 QTC Calculation: 452 R Axis:   60 Text Interpretation:  Normal sinus rhythm Normal ECG Confirmed by Romolo Sieling MD, Barbara Cower [phone]) on 07/12/2016 2:50:54 PM       Radiology Dg Chest 2 View  Result Date: 07/12/2016 CLINICAL DATA:  Tightness in  chest.  Wheezing. EXAM: CHEST  2 VIEW COMPARISON:  May 07, 2016 FINDINGS: Callus formation at the left lateral rib fracture seen on the previous study. Degenerative changes in the spine. No pneumothorax. Small nodule measuring 5 mm in the right base, probably unchanged given difference in positioning. Interstitial coarsening in the bases with more focal opacity on the left, probably in the lingula based on the lateral view. No other acute abnormalities identified. IMPRESSION: 1. 5 mm nodule in the right lung. Recommend comparison to remote outside films if available. Otherwise, recommend CT scan. 2. Bronchitic changes in the bases. More focal opacity in the lingula may represent developing pneumonia. Recommend follow-up to resolution. Electronically Signed   By: Gerome Samavid  Williams III M.D   On: 07/12/2016 13:10    Procedures Procedures (including critical care time)  Medications Ordered in ED Medications  levofloxacin (LEVAQUIN) tablet 500 mg (500 mg Oral Given 07/12/16 1552)  albuterol (PROVENTIL HFA;VENTOLIN HFA) 108 (90 Base) MCG/ACT inhaler 2 puff (2 puffs Inhalation Given 07/12/16 1743)     Initial Impression / Assessment and Plan / ED Course  I have reviewed the triage vital signs and the nursing notes.  Pertinent labs & imaging results that were available during my care of the patient were reviewed by me and considered in my medical decision making (see chart for details).  Clinical Course    Patient was sent here for concerns of atrial fibrillation however here she is in  sinus rhythm and on review the EKGs from there it is slightly irregular but has a very poor baseline is difficult to tell whether she has A. flutter versus A. fib versus sinus rhythm with a sinus arrhythmia. She likely has pneumonia on her chest x-ray and her story is concerning for pneumonia. We'll do a cardiac workup if this is negative all discussed with cardiology about any further workup for paroxysmal atrial fibrillation however I suspect that she can likely go home.  Patient monitored for multiple hours without arrhythmias. Feels well. improed breathing with duoneb. Discussed admission for workup/monitoring however she, being competent to make decisions, declines. Alternative treatment plan was decided to increase her HCTZ at home for the slight fluid overload, treat bronchitis and follow up with dr. Gerda Dissluking for further management. Will retn for any worsening symptoms.   Final Clinical Impressions(s) / ED Diagnoses   Final diagnoses:  Dyspnea, unspecified type  Congestive heart failure, unspecified congestive heart failure chronicity, unspecified congestive heart failure type (HCC)  Community acquired pneumonia of left lung, unspecified part of lung    New Prescriptions Discharge Medication List as of 07/12/2016  5:40 PM    START taking these medications   Details  benzonatate (TESSALON) 100 MG capsule Take 1 capsule (100 mg total) by mouth every 8 (eight) hours., Starting Sat 07/12/2016, Print    levofloxacin (LEVAQUIN) 750 MG tablet Take 1 tablet (750 mg total) by mouth daily. X 7 days, Starting Sat 07/12/2016, Print         Marily MemosJason Faiz Weber, MD 07/13/16 [phone]

## 2016-07-12 NOTE — Discharge Instructions (Signed)
Please take your HCTZ daily for the next 5 days then return to normal dosing Use albuterol as needed for coughing Follow up with your primary doctor in 1-2 weeks for further management. Return ehre for for worsening shortness of breath, coughing, fever, weakness or other concern.

## 2016-07-12 NOTE — Discharge Instructions (Signed)
Your heart is in a rhythm called Atrial fibrillation. Please go directly to the emergency room for additional evaluation and management. You'll need an echocardiogram. This is an ultrasound of your heart. You will also need some medication, either a blood thinner or medicine to keep you in the regular heart rhythm. Depending on what the cardiologist says, you may need to be admitted to the hospital.

## 2016-07-12 NOTE — ED Triage Notes (Signed)
[facility] Cough and wheezing. She states she has some chest discomfort. Patient states that she has had the cough for 2-3 days. Patient states that she has not tried anything over the counter. She states she also has broken rib on the Left side #6.

## 2016-07-13 ENCOUNTER — Telehealth: Payer: Self-pay | Admitting: Family Medicine

## 2016-07-13 NOTE — Telephone Encounter (Signed)
This patient was just recently in the hospital in was released due to significant shortness of breath and mild failure issues it was recommended for her to follow-up within 14 days with us in addition to this it was also recommended for this patient to consider getting CT scan of the chest nurses-#1 call patient make sure that she is doing okay in if she has any questions regarding her medication please let us know. She should be doing daily weights each morning and recording these. Avoid excessive salt in the diet and she should schedule follow-up visit either this week or next week with me please documented accordingly thank you

## 2016-07-14 NOTE — Telephone Encounter (Signed)
Left message return call 07/14/16 

## 2016-07-14 NOTE — Telephone Encounter (Signed)
Notified daughter that patient was just recently in the hospital and was released due to significant shortness of breath and mild failure issues it was recommended for her to follow-up within 14 days with us in addition to this it was also recommended for this patient to consider getting CT scan of the chest -#1 call patient make sure that she is doing okay in if she has any questions regarding her medication please let us know. She should be doing daily weights each morning and recording these. Avoid excessive salt in the diet and she should schedule follow-up visit either this week or next week with Dr. Lorin PicketScott. Patient is doing better and daughter was transferred to front desk to schedule appointment.

## 2016-07-18 ENCOUNTER — Ambulatory Visit (INDEPENDENT_AMBULATORY_CARE_PROVIDER_SITE_OTHER): Payer: Medicare Other | Admitting: Family Medicine

## 2016-07-18 ENCOUNTER — Encounter: Payer: Self-pay | Admitting: Family Medicine

## 2016-07-18 VITALS — BP 132/84 | HR 65 | Ht 67.0 in | Wt 200.0 lb

## 2016-07-18 DIAGNOSIS — R0609 Other forms of dyspnea: Secondary | ICD-10-CM | POA: Diagnosis not present

## 2016-07-18 DIAGNOSIS — R0600 Dyspnea, unspecified: Secondary | ICD-10-CM

## 2016-07-18 DIAGNOSIS — M791 Myalgia, unspecified site: Secondary | ICD-10-CM

## 2016-07-18 DIAGNOSIS — J3089 Other allergic rhinitis: Secondary | ICD-10-CM | POA: Diagnosis not present

## 2016-07-18 DIAGNOSIS — R600 Localized edema: Secondary | ICD-10-CM | POA: Diagnosis not present

## 2016-07-18 NOTE — Progress Notes (Signed)
   Subjective:    Patient ID: Vickie Willis, female    DOB: 1931/02/18, 82 y.o.   MRN: [id number]  HPIFollow up hospitalization.  This patient was initially seen in urgent care center today.she had atrial fib then she ended up having go to ER they treated her in the ER released her. She was having some pedal edema elevated BNP she relates in the past she's had echo she thinks it was okay this was in [place] she was from  She states that they told her to take HCTZ and put her on a short course of prednisone she states that that seemed to help her feel better she wonders if she ought to be on ongoing dose of prednisone Concerns about when she stops prednisone her body feels stiff. Has upcoming with Rheumatology on dec 19th.   Still has head congestion. One more day left on levaquin. Denies any fever chills wheezing difficulty breathing   Review of Systems See above. No chest tightness pressure pain no shortness of breath unless she really pushes herself    Objective:   Physical Exam HEENT is benign neck no masses lungs clear no crackles heart regular no murmurs extremities no edema       Assessment & Plan:  It is possible that there could be some underlying CHF we will recheck BNP Patient states she had echo at her previous doctor we will try to get the results of this She will be going back to [place] in January I think she can get further workup of these issues then I have advised the patient hemoglobin needs further workup I have also advised her that she will need a CT scan because her chest x-ray showed pulmonary nodule Patient states while she was on prednisone her aches and pains get better we will check a sedimentation rate I doubt polymyalgia rheumatica but that is a possibility

## 2016-07-18 NOTE — Progress Notes (Signed)
Subjective:     Patient ID: Vickie Willis, female   DOB: 12-19-1930, 82 y.o.   MRN: [id number]  HPI   Review of Systems     Objective:   Physical Exam     Assessment:        Plan:

## 2016-07-19 LAB — SEDIMENTATION RATE: Sed Rate: 18 mm/hr (ref 0–40)

## 2016-07-19 LAB — BRAIN NATRIURETIC PEPTIDE: BNP: 220.3 pg/mL — ABNORMAL HIGH (ref 0.0–100.0)

## 2016-07-23 ENCOUNTER — Telehealth: Payer: Self-pay | Admitting: Family Medicine

## 2016-07-23 ENCOUNTER — Ambulatory Visit (INDEPENDENT_AMBULATORY_CARE_PROVIDER_SITE_OTHER): Payer: Medicare Other | Admitting: Family Medicine

## 2016-07-23 ENCOUNTER — Encounter: Payer: Self-pay | Admitting: Family Medicine

## 2016-07-23 VITALS — BP 140/82 | Temp 98.6°F | Ht 67.0 in | Wt 198.6 lb

## 2016-07-23 DIAGNOSIS — N3000 Acute cystitis without hematuria: Secondary | ICD-10-CM | POA: Diagnosis not present

## 2016-07-23 DIAGNOSIS — R300 Dysuria: Secondary | ICD-10-CM

## 2016-07-23 MED ORDER — SULFAMETHOXAZOLE-TRIMETHOPRIM 800-160 MG PO TABS: 1.0000 | ORAL_TABLET | Freq: Two times a day (BID) | ORAL | 0 refills | Status: DC

## 2016-07-23 NOTE — Telephone Encounter (Signed)
ERROR

## 2016-07-23 NOTE — Progress Notes (Signed)
   Subjective:    Patient ID: Vickie Willis, female    DOB: 07-15-1931, 82 y.o.   MRN: [id number]  HPI Patient with c/o dysuria since Monday. Patient finished Levaquin 750mg  on sat for bronchitis.  rPatient notes increased urinary frequency for the past several days. Also some dysuria.  Notes some right flank discomfort. Accompanied by nausea.  No high fever or chills  Review of Systems No rash see above    Objective:   Physical Exam Alert hydration good vitals stable lungs clear heart regular rhythm question right CVA tenderness positive suprapubic tenderness urinalysis 8-10 white blood cells per high-power field       Assessment & Plan:  Impression urinary tract infection potential kidney involvement discussed plan antibiotics prescribed symptom care discussed warning signs discussed urine culture

## 2016-07-25 ENCOUNTER — Other Ambulatory Visit: Payer: Self-pay | Admitting: *Deleted

## 2016-07-25 ENCOUNTER — Telehealth: Payer: Self-pay | Admitting: Family Medicine

## 2016-07-25 LAB — URINE CULTURE: Organism ID, Bacteria: NO GROWTH

## 2016-07-25 MED ORDER — NITROFURANTOIN MONOHYD MACRO 100 MG PO CAPS: 100.0000 mg | ORAL_CAPSULE | Freq: Two times a day (BID) | ORAL | 0 refills | Status: DC

## 2016-07-25 NOTE — Telephone Encounter (Signed)
Discussed with family. Med sent to pharm.

## 2016-07-25 NOTE — Telephone Encounter (Signed)
Call family plz. macrobid 100 bid seven d, also notify ur cx did not grow nything but since pt was just coming off the the levaquin this can occur, take all the macrobid, if symtoms worsen this weak with sickness, would rec going to ER for further testing

## 2016-07-25 NOTE — Telephone Encounter (Signed)
Patient was prescribed sulfamethoxazole-trimethoprim on 07/23/16 by Dr. Lorin PicketScott and its making her sick.  Her son in law is requesting the antibiotic to be changed to something different.  [facility]

## 2016-07-29 ENCOUNTER — Encounter (HOSPITAL_COMMUNITY): Payer: Self-pay | Admitting: Hematology & Oncology

## 2016-07-29 ENCOUNTER — Encounter (HOSPITAL_COMMUNITY): Payer: Medicare Other | Attending: Hematology & Oncology | Admitting: Hematology & Oncology

## 2016-07-29 ENCOUNTER — Encounter (HOSPITAL_COMMUNITY): Payer: Medicare Other

## 2016-07-29 VITALS — BP 128/96 | Wt 199.0 lb

## 2016-07-29 DIAGNOSIS — K921 Melena: Secondary | ICD-10-CM | POA: Insufficient documentation

## 2016-07-29 DIAGNOSIS — Z7901 Long term (current) use of anticoagulants: Secondary | ICD-10-CM

## 2016-07-29 DIAGNOSIS — D509 Iron deficiency anemia, unspecified: Secondary | ICD-10-CM | POA: Insufficient documentation

## 2016-07-29 DIAGNOSIS — Z86718 Personal history of other venous thrombosis and embolism: Secondary | ICD-10-CM | POA: Insufficient documentation

## 2016-07-29 DIAGNOSIS — Z9889 Other specified postprocedural states: Secondary | ICD-10-CM | POA: Insufficient documentation

## 2016-07-29 DIAGNOSIS — E210 Primary hyperparathyroidism: Secondary | ICD-10-CM | POA: Diagnosis not present

## 2016-07-29 DIAGNOSIS — G4733 Obstructive sleep apnea (adult) (pediatric): Secondary | ICD-10-CM

## 2016-07-29 DIAGNOSIS — E890 Postprocedural hypothyroidism: Secondary | ICD-10-CM | POA: Insufficient documentation

## 2016-07-29 DIAGNOSIS — E1122 Type 2 diabetes mellitus with diabetic chronic kidney disease: Secondary | ICD-10-CM | POA: Diagnosis not present

## 2016-07-29 DIAGNOSIS — Z832 Family history of diseases of the blood and blood-forming organs and certain disorders involving the immune mechanism: Secondary | ICD-10-CM

## 2016-07-29 DIAGNOSIS — R5383 Other fatigue: Secondary | ICD-10-CM

## 2016-07-29 DIAGNOSIS — Z880 Allergy status to penicillin: Secondary | ICD-10-CM | POA: Diagnosis not present

## 2016-07-29 DIAGNOSIS — N183 Chronic kidney disease, stage 3 unspecified: Secondary | ICD-10-CM

## 2016-07-29 DIAGNOSIS — E039 Hypothyroidism, unspecified: Secondary | ICD-10-CM | POA: Diagnosis not present

## 2016-07-29 DIAGNOSIS — D649 Anemia, unspecified: Secondary | ICD-10-CM | POA: Diagnosis present

## 2016-07-29 DIAGNOSIS — N63 Unspecified lump in unspecified breast: Secondary | ICD-10-CM | POA: Diagnosis not present

## 2016-07-29 DIAGNOSIS — N184 Chronic kidney disease, stage 4 (severe): Secondary | ICD-10-CM | POA: Insufficient documentation

## 2016-07-29 DIAGNOSIS — I129 Hypertensive chronic kidney disease with stage 1 through stage 4 chronic kidney disease, or unspecified chronic kidney disease: Secondary | ICD-10-CM | POA: Diagnosis not present

## 2016-07-29 DIAGNOSIS — E559 Vitamin D deficiency, unspecified: Secondary | ICD-10-CM | POA: Insufficient documentation

## 2016-07-29 DIAGNOSIS — Z79899 Other long term (current) drug therapy: Secondary | ICD-10-CM | POA: Diagnosis not present

## 2016-07-29 DIAGNOSIS — Z8639 Personal history of other endocrine, nutritional and metabolic disease: Secondary | ICD-10-CM

## 2016-07-29 DIAGNOSIS — M8580 Other specified disorders of bone density and structure, unspecified site: Secondary | ICD-10-CM | POA: Insufficient documentation

## 2016-07-29 LAB — CBC WITH DIFFERENTIAL/PLATELET
Basophils Absolute: 0.1 10*3/uL (ref 0.0–0.1)
Basophils Relative: 1 %
Eosinophils Absolute: 0.5 10*3/uL (ref 0.0–0.7)
Eosinophils Relative: 8 %
HEMATOCRIT: 27.1 % — AB (ref 36.0–46.0)
HEMOGLOBIN: 8.4 g/dL — AB (ref 12.0–15.0)
LYMPHS ABS: 1.4 10*3/uL (ref 0.7–4.0)
Lymphocytes Relative: 21 %
MCH: 24.3 pg — AB (ref 26.0–34.0)
MCHC: 31 g/dL (ref 30.0–36.0)
MCV: 78.6 fL (ref 78.0–100.0)
MONOS PCT: 6 %
Monocytes Absolute: 0.4 10*3/uL (ref 0.1–1.0)
NEUTROS ABS: 4.1 10*3/uL (ref 1.7–7.7)
NEUTROS PCT: 64 %
Platelets: 324 10*3/uL (ref 150–400)
RBC: 3.45 MIL/uL — ABNORMAL LOW (ref 3.87–5.11)
RDW: 19.7 % — ABNORMAL HIGH (ref 11.5–15.5)
WBC: 6.4 10*3/uL (ref 4.0–10.5)

## 2016-07-29 LAB — COMPREHENSIVE METABOLIC PANEL
ALK PHOS: 62 U/L (ref 38–126)
ALT: 13 U/L — ABNORMAL LOW (ref 14–54)
ANION GAP: 9 (ref 5–15)
AST: 24 U/L (ref 15–41)
Albumin: 2.9 g/dL — ABNORMAL LOW (ref 3.5–5.0)
BILIRUBIN TOTAL: 0.3 mg/dL (ref 0.3–1.2)
BUN: 22 mg/dL — ABNORMAL HIGH (ref 6–20)
CALCIUM: 8 mg/dL — AB (ref 8.9–10.3)
CO2: 24 mmol/L (ref 22–32)
Chloride: 104 mmol/L (ref 101–111)
Creatinine, Ser: 1.38 mg/dL — ABNORMAL HIGH (ref 0.44–1.00)
GFR calc non Af Amer: 34 mL/min — ABNORMAL LOW (ref >60)
GFR, EST AFRICAN AMERICAN: 40 mL/min — AB (ref >60)
GLUCOSE: 112 mg/dL — AB (ref 65–99)
Potassium: 3.8 mmol/L (ref 3.5–5.1)
Sodium: 137 mmol/L (ref 135–145)
TOTAL PROTEIN: 6.5 g/dL (ref 6.5–8.1)

## 2016-07-29 LAB — IRON AND TIBC
Iron: 21 ug/dL — ABNORMAL LOW (ref 28–170)
SATURATION RATIOS: 7 % — AB (ref 10.4–31.8)
TIBC: 290 ug/dL (ref 250–450)
UIBC: 269 ug/dL

## 2016-07-29 LAB — RETICULOCYTES
RBC.: 3.45 MIL/uL — AB (ref 3.87–5.11)
Retic Count, Absolute: 34.5 10*3/uL (ref 19.0–186.0)
Retic Ct Pct: 1 % (ref 0.4–3.1)

## 2016-07-29 LAB — FOLATE: FOLATE: 16.3 ng/mL (ref >5.9)

## 2016-07-29 LAB — LACTATE DEHYDROGENASE: LDH: 179 U/L (ref 98–192)

## 2016-07-29 LAB — VITAMIN B12: VITAMIN B 12: 856 pg/mL (ref 180–914)

## 2016-07-29 LAB — SEDIMENTATION RATE: Sed Rate: 79 mm/hr — ABNORMAL HIGH (ref 0–22)

## 2016-07-29 LAB — FERRITIN: FERRITIN: 109 ng/mL (ref 11–307)

## 2016-07-29 NOTE — Patient Instructions (Addendum)
[facility] at [facility] Discharge Instructions  RECOMMENDATIONS MADE BY THE CONSULTANT AND ANY TEST RESULTS WILL BE SENT TO YOUR REFERRING PHYSICIAN.  You saw Dr. Galen ManilaPenland today. Follow up in 3 weeks. Labs today- we will call you with results. See Amy at checkout for appointments.  Thank you for choosing [facility] at [facility] to provide your oncology and hematology care.  To afford each patient quality time with our provider, please arrive at least 15 minutes before your scheduled appointment time.   Beginning January 23rd 2017 lab work for the [facility] will be done in the  Main lab at [facility] on 1st floor. If you have a lab appointment with the Cancer Center please come in thru the  Main Entrance and check in at the main information desk  You need to re-schedule your appointment should you arrive 10 or more minutes late.  We strive to give you quality time with our providers, and arriving late affects you and other patients whose appointments are after yours.  Also, if you no show three or more times for appointments you may be dismissed from the clinic at the providers discretion.     Again, thank you for choosing [facility].  Our hope is that these requests will decrease the amount of time that you wait before being seen by our physicians.       _____________________________________________________________  Should you have questions after your visit to [facility], please contact our office at [phone] between the hours of 8:30 a.m. and 4:30 p.m.  Voicemails left after 4:30 p.m. will not be returned until the following business day.  For prescription refill requests, have your pharmacy contact our office.         Resources For Cancer Patients and their Caregivers ? American Cancer Society: Can assist with transportation, wigs, general needs, runs Look Good Feel Better.         [phone] ? Cancer Care: Provides financial assistance, online support groups, medication/co-pay assistance.  1-800-813-HOPE 717-464-2060(4673) ? [facility] Assists [place] cancer patients and their families through emotional , educational and financial support.  [phone] ? [place] DSS Where to apply for food stamps, Medicaid and utility assistance. 95243245803610460918 ? RCATS: Transportation to medical appointments. [phone] ? Social Security Administration: May apply for disability if have a Stage IV cancer. [phone] ? [place] A[place], Disability and Transit Services: Assists with nutrition, care and transit needs. [phone]  Cancer Center Support Programs: @10RELATIVEDAYS @ > Cancer Support Group  2nd Tuesday of the month 1pm-2pm, Journey Room  > Creative Journey  3rd Tuesday of the month 1130am-1pm, Journey Room  > Look Good Feel Better  1st Wednesday of the month 10am-12 noon, Journey Room (Call American Cancer Society to register [phone])

## 2016-07-29 NOTE — Progress Notes (Signed)
[facility] NOTE  Patient Care Team: Vickie Drown, MD as PCP - General (Family Medicine)  CHIEF COMPLAINTS/PURPOSE OF CONSULTATION:  Anemia  HISTORY OF PRESENTING ILLNESS:  Vickie Willis 82 y.o. female is here for a consultation for anemia. She has OSA on CPAP, HX primary hyperparathyroidism s/p thyroidectomy on replacement, osteopenia, Stage III CKD, vitamin D deficiency, iron deficiency. She notes significant fatigue.   She follows with multiple specialists at [facility] in [place], [place]. Well known to me from my prior practice. She was followed by nephrology; last visit in august as noted below:       Patient is accompanied by her daughter. She is from [place], [place] and is planning to return in a few months.   She has had anemia for some time that has recently worsened. Hemoglobin was ~8 on 07/12/16. Vickie Willis also has past medical history of iron deficiency.  Reports severe fatigue and sleeping throughout the day. Denies craving ice. Also reports having had hematochezia after a recent fall. Symptoms have since subsided. Patient has history of hemorrhoids.  Vickie Willis notes that she had a double mastectomy because she had precancerous masses in both breasts.  She has difficulty ambulating due to fatigue.   MEDICAL HISTORY:  Past Medical History:  Diagnosis Date  . Diabetes mellitus without complication ([place])   . Hypertension   . Renal disorder     SURGICAL HISTORY: Past Surgical History:  Procedure Laterality Date  . PARATHYROIDECTOMY / EXPLORATION OF PARATHYROIDS    . STENT PLACEMENT VASCULAR ([facility] HX)      SOCIAL HISTORY: Social History   Social History  . Marital status: Widowed    Spouse name: N/A  . Number of children: N/A  . Years of education: N/A   Occupational History  . Not on file.   Social History Main Topics  . Smoking status: Never Smoker  . Smokeless tobacco: Never Used  . Alcohol use No  . Drug  use: No  . Sexual activity: Not on file   Other Topics Concern  . Not on file   Social History Narrative  . No narrative on file  Born in [place] Widowed 2 children  2 grandchildren, 2 great grandchildren Never smoked Hobbies include crochet and gardening outside  FAMILY HISTORY: History reviewed. No pertinent family history. Mother passed at 59 yo (heart failure) and Father passed at 71 yo (cancer) 1 brother died of heart attack, 1 sister still living  ALLERGIES:  is allergic to penicillins.  MEDICATIONS:  Current Outpatient Prescriptions  Medication Sig Dispense Refill  . benzonatate (TESSALON) 100 MG capsule Take 1 capsule (100 mg total) by mouth every 8 (eight) hours. 21 capsule 0  . calcitRIOL (ROCALTROL) 0.5 MCG capsule Take 0.5 mcg by mouth daily.    . cetirizine (ZYRTEC) 10 MG tablet Take 10 mg by mouth daily.    . clopidogrel (PLAVIX) 75 MG tablet Take 75 mg by mouth daily.    . Coconut Oil 1000 MG CAPS Take by mouth 2 (two) times daily.    Marland Kitchen doxazosin (CARDURA) 1 MG tablet Take 1 mg by mouth daily.    Marland Kitchen esomeprazole (NEXIUM) 40 MG capsule Take 40 mg by mouth daily.     . ferrous sulfate 325 (65 FE) MG tablet Take 1 tablet (325 mg total) by mouth 2 (two) times daily with a meal. 60 tablet 0  . gabapentin (NEURONTIN) 100 MG capsule Take 100 mg by mouth 2 (two) times daily.    Marland Kitchen  Garlic 7530 MG CAPS Take 1,000 mg by mouth daily.     Marland Kitchen glimepiride (AMARYL) 1 MG tablet Take 1 mg by mouth daily as needed. High blood sugar    . HYDROcodone-acetaminophen (NORCO) 7.5-325 MG tablet Take 1 tablet by mouth every 4 (four) hours as needed.    Marland Kitchen levofloxacin (LEVAQUIN) 750 MG tablet Take 1 tablet (750 mg total) by mouth daily. X 7 days 7 tablet 0  . levothyroxine (SYNTHROID, LEVOTHROID) 100 MCG tablet Take 100 mcg by mouth daily before breakfast.    . lisinopril (PRINIVIL,ZESTRIL) 20 MG tablet Take 20 mg by mouth daily.    Marland Kitchen NIFEdipine (PROCARDIA XL/ADALAT-CC) 60 MG 24 hr tablet  Take 60 mg by mouth daily.    . nitrofurantoin, macrocrystal-monohydrate, (MACROBID) 100 MG capsule Take 1 capsule (100 mg total) by mouth 2 (two) times daily. 14 capsule 0  . Omega-3 Fatty Acids (FISH OIL) 1200 MG CAPS Take by mouth 2 (two) times daily at 10 AM and 5 PM.    . predniSONE (DELTASONE) 10 MG tablet take 4 daily for the next 4 days then 3 daily for 4 days then 2 a day for 4 days then 1 a day for 4 days. 40 tablet 0  . sertraline (ZOLOFT) 100 MG tablet Take 50 mg by mouth daily.    Marland Kitchen sulfamethoxazole-trimethoprim (BACTRIM DS,SEPTRA DS) 800-160 MG tablet Take 1 tablet by mouth 2 (two) times daily. 20 tablet 0  . Vitamin D, Ergocalciferol, (DRISDOL) 50000 units CAPS capsule Take 50,000 Units by mouth every 7 (seven) days. wednesday     No current facility-administered medications for this visit.     Review of Systems  Constitutional: Positive for malaise/fatigue.  HENT: Negative.   Eyes: Negative.   Respiratory: Negative.   Cardiovascular: Negative.   Gastrointestinal: Negative.   Genitourinary: Negative.   Musculoskeletal: Negative.   Skin: Negative.   Neurological: Negative.   Endo/Heme/Allergies: Negative.   Psychiatric/Behavioral: Negative.   All other systems reviewed and are negative. 14 point ROS was done and is otherwise as detailed above or in HPI   PHYSICAL EXAMINATION: ECOG PERFORMANCE STATUS: 2 - Symptomatic, <50% confined to bed  Vitals:   07/29/16 1442  BP: (!) 128/96   Filed Weights   07/29/16 1442  Weight: 199 lb (90.3 kg)    Physical Exam  Constitutional: She is oriented to person, place, and time and well-developed, well-nourished, and in no distress.  Assistance onto the exam table.  HENT:  Head: Normocephalic and atraumatic.  Mouth/Throat: Oropharynx is clear and moist. No oropharyngeal exudate.  Eyes: Conjunctivae and EOM are normal. Pupils are equal, round, and reactive to light. No scleral icterus.  Neck: Normal range of motion. Neck  supple.  Cardiovascular: Normal rate, regular rhythm and normal heart sounds.   Pulmonary/Chest: Effort normal and breath sounds normal. No respiratory distress.  Abdominal: Soft. Bowel sounds are normal. She exhibits no distension and no mass. There is no tenderness. There is no rebound and no guarding.  Musculoskeletal: Normal range of motion.  Lymphadenopathy:    She has no cervical adenopathy.  Neurological: She is alert and oriented to person, place, and time. No cranial nerve deficit.  Skin: Skin is warm and dry.  Psychiatric: Mood, memory, affect and judgment normal.  Nursing note and vitals reviewed.   LABORATORY DATA:  I have reviewed the data as listed Lab Results  Component Value Date   WBC 10.6 (H) 07/12/2016   HGB 8.7 (L) 07/12/2016  HCT 29.5 (L) 07/12/2016   MCV 78.2 07/12/2016   PLT 174 07/12/2016   CMP     Component Value Date/Time   NA 142 07/12/2016 1414   K 4.1 07/12/2016 1414   CL 108 07/12/2016 1414   CO2 25 07/12/2016 1414   GLUCOSE 185 (H) 07/12/2016 1414   BUN 20 07/12/2016 1414   CREATININE 1.54 (H) 07/12/2016 1414   CALCIUM 7.7 (L) 07/12/2016 1414   PROT 7.5 05/07/2016 1358   ALBUMIN 3.3 (L) 05/07/2016 1358   AST 15 05/07/2016 1358   ALT 10 (L) 05/07/2016 1358   ALKPHOS 71 05/07/2016 1358   BILITOT 0.8 05/07/2016 1358   GFRNONAA 30 (L) 07/12/2016 1414   GFRAA 35 (L) 07/12/2016 1414     RADIOGRAPHIC STUDIES: I have personally reviewed the radiological images as listed and agreed with the findings in the report. No results found.  ASSESSMENT & PLAN:  Anemia, microcytic, hypochromic H/O iron deficiency anemia H/O hematochezia CKD stage IV H/O hyperparathyroidism Hypothyroidism OSA Hx DVT  She is provided education regarding anemia.  We discussed that her kidney disease is certainly contributing. We discussed pursuing an anemia evaluation, she is agreeable. We will start with peripheral labs today.   I suspect the cause of her  fatigue is multifactorial but certainly her anemia is contributing. It may be time to start ESA therapy and we briefly addressed this today. She notes that she does not see physicans in [place] where she lives. I joked that ironically I practiced in [place] for almost 10 years and she is seeing me here in [place] at [facility]! Point being, it may be that if she starts ESA therapy it would be easier for her to get it locally in [place] then to travel to [place], but we can address at follow-up.   Her daughter was diagnosed with factor V leiden. The patient has a history of DVT. She is however on anticoagulation. She would like to be checked for Factor V. We will add this today.   Labs today: CBC diff, CMET, ESR, Retic count, SPEP+IFE, light chain assay, IgG, IgA, IgM, anemia panel, haptoglobin, LDH, Factor V Leiden.  If iron deficiency is identified/confirmed, then we will replace iron via IV based upon iron deficit calculation.  I have reviewed the risks, benefits, alternatives, and side effects of IV iron (if needed) including, but not limited to, anaphylaxis, death, discomfort at infusion site, etc.  She is advised that the risk of allergic reaction is less than 1%.  Patient would like to be tested for Factor V Leiden mutation as her daughter is positive.  Order is placed, as mentioned above, for this lab test.  We will call patient with lab results and address nutritional deficiencies as indicated.   She will return in 2-3 weeks for follow-up and review of lab results.  ORDERS PLACED FOR THIS ENCOUNTER: Orders Placed This Encounter  Procedures  . CBC with Differential  . Comprehensive metabolic panel  . Sedimentation rate  . Reticulocytes  . Kappa/lambda light chains  . Immunofixation electrophoresis  . Protein electrophoresis, serum  . IgG, IgA, IgM  . Iron and TIBC  . Ferritin  . Vitamin B12  . Folate  . Haptoglobin  . Lactate dehydrogenase  . Factor 5 leiden    All questions  were answered. The patient knows to call the clinic with any problems, questions or concerns.  This document serves as a record of services personally performed by Ancil Linsey, MD. It  was created on her behalf by Elmyra Ricks, a trained medical scribe. The creation of this record is based on the scribe's personal observations and the provider's statements to them. This document has been checked and approved by the attending provider.  I have reviewed the above documentation for accuracy and completeness and I agree with the above.  This note was electronically signed.    Molli Hazard, MD  07/29/2016 3:27 PM

## 2016-07-30 LAB — KAPPA/LAMBDA LIGHT CHAINS
KAPPA FREE LGHT CHN: 57.2 mg/L — AB (ref 3.3–19.4)
Kappa, lambda light chain ratio: 1.02 (ref 0.26–1.65)
LAMDA FREE LIGHT CHAINS: 56 mg/L — AB (ref 5.7–26.3)

## 2016-07-30 LAB — IGG, IGA, IGM
IGA: 315 mg/dL (ref 64–422)
IGG (IMMUNOGLOBIN G), SERUM: 804 mg/dL (ref 700–1600)
IGM, SERUM: 52 mg/dL (ref 26–217)

## 2016-07-30 LAB — HAPTOGLOBIN: Haptoglobin: 361 mg/dL — ABNORMAL HIGH (ref 34–200)

## 2016-07-31 LAB — IMMUNOFIXATION ELECTROPHORESIS
IGM, SERUM: 51 mg/dL (ref 26–217)
IgA: 308 mg/dL (ref 64–422)
IgG (Immunoglobin G), Serum: 814 mg/dL (ref 700–1600)
TOTAL PROTEIN ELP: 6.1 g/dL (ref 6.0–8.5)

## 2016-07-31 LAB — PROTEIN ELECTROPHORESIS, SERUM
A/G RATIO SPE: 0.8 (ref 0.7–1.7)
ALPHA-1-GLOBULIN: 0.3 g/dL (ref 0.0–0.4)
ALPHA-2-GLOBULIN: 1 g/dL (ref 0.4–1.0)
Albumin ELP: 2.7 g/dL — ABNORMAL LOW (ref 2.9–4.4)
BETA GLOBULIN: 1.1 g/dL (ref 0.7–1.3)
Gamma Globulin: 0.9 g/dL (ref 0.4–1.8)
Globulin, Total: 3.4 g/dL (ref 2.2–3.9)
Total Protein ELP: 6.1 g/dL (ref 6.0–8.5)

## 2016-08-05 ENCOUNTER — Encounter (HOSPITAL_COMMUNITY): Payer: Self-pay | Admitting: Hematology & Oncology

## 2016-08-05 LAB — FACTOR 5 LEIDEN

## 2016-08-14 ENCOUNTER — Other Ambulatory Visit (HOSPITAL_COMMUNITY): Payer: Self-pay | Admitting: Oncology

## 2016-08-14 ENCOUNTER — Encounter (HOSPITAL_COMMUNITY): Payer: Self-pay | Admitting: Oncology

## 2016-08-14 ENCOUNTER — Encounter (HOSPITAL_COMMUNITY): Payer: Medicare Other | Attending: Hematology & Oncology

## 2016-08-14 ENCOUNTER — Encounter (HOSPITAL_COMMUNITY): Payer: Self-pay

## 2016-08-14 ENCOUNTER — Ambulatory Visit (HOSPITAL_COMMUNITY): Payer: 59

## 2016-08-14 VITALS — BP 136/44 | HR 75 | Temp 98.0°F | Resp 16 | Wt 193.6 lb

## 2016-08-14 DIAGNOSIS — E210 Primary hyperparathyroidism: Secondary | ICD-10-CM | POA: Insufficient documentation

## 2016-08-14 DIAGNOSIS — E611 Iron deficiency: Secondary | ICD-10-CM

## 2016-08-14 DIAGNOSIS — E1122 Type 2 diabetes mellitus with diabetic chronic kidney disease: Secondary | ICD-10-CM | POA: Insufficient documentation

## 2016-08-14 DIAGNOSIS — I129 Hypertensive chronic kidney disease with stage 1 through stage 4 chronic kidney disease, or unspecified chronic kidney disease: Secondary | ICD-10-CM | POA: Insufficient documentation

## 2016-08-14 DIAGNOSIS — N184 Chronic kidney disease, stage 4 (severe): Secondary | ICD-10-CM

## 2016-08-14 DIAGNOSIS — Z86718 Personal history of other venous thrombosis and embolism: Secondary | ICD-10-CM | POA: Insufficient documentation

## 2016-08-14 DIAGNOSIS — Z9889 Other specified postprocedural states: Secondary | ICD-10-CM | POA: Insufficient documentation

## 2016-08-14 DIAGNOSIS — K921 Melena: Secondary | ICD-10-CM | POA: Insufficient documentation

## 2016-08-14 DIAGNOSIS — D631 Anemia in chronic kidney disease: Secondary | ICD-10-CM | POA: Diagnosis not present

## 2016-08-14 DIAGNOSIS — D509 Iron deficiency anemia, unspecified: Secondary | ICD-10-CM

## 2016-08-14 DIAGNOSIS — N63 Unspecified lump in unspecified breast: Secondary | ICD-10-CM | POA: Insufficient documentation

## 2016-08-14 DIAGNOSIS — M8580 Other specified disorders of bone density and structure, unspecified site: Secondary | ICD-10-CM | POA: Insufficient documentation

## 2016-08-14 DIAGNOSIS — Z79899 Other long term (current) drug therapy: Secondary | ICD-10-CM | POA: Insufficient documentation

## 2016-08-14 DIAGNOSIS — E890 Postprocedural hypothyroidism: Secondary | ICD-10-CM | POA: Insufficient documentation

## 2016-08-14 DIAGNOSIS — G4733 Obstructive sleep apnea (adult) (pediatric): Secondary | ICD-10-CM | POA: Insufficient documentation

## 2016-08-14 DIAGNOSIS — Z880 Allergy status to penicillin: Secondary | ICD-10-CM | POA: Insufficient documentation

## 2016-08-14 DIAGNOSIS — E559 Vitamin D deficiency, unspecified: Secondary | ICD-10-CM | POA: Insufficient documentation

## 2016-08-14 HISTORY — DX: Iron deficiency: E61.1

## 2016-08-14 MED ORDER — NA FERRIC GLUC CPLX IN SUCROSE 12.5 MG/ML IV SOLN
125.0000 mg | Freq: Once | INTRAVENOUS | Status: AC
  Administered 2016-08-14: 125 mg via INTRAVENOUS
  Filled 2016-08-14: qty 10

## 2016-08-14 MED ORDER — SODIUM CHLORIDE 0.9 % IV SOLN
Freq: Once | INTRAVENOUS | Status: AC
  Administered 2016-08-14: 15:00:00 via INTRAVENOUS

## 2016-08-14 NOTE — Patient Instructions (Signed)
[facility] at [facility] Discharge Instructions  RECOMMENDATIONS MADE BY THE CONSULTANT AND ANY TEST RESULTS WILL BE SENT TO YOUR REFERRING PHYSICIAN.  IV iron today.    Thank you for choosing [facility] at [facility] to provide your oncology and hematology care.  To afford each patient quality time with our provider, please arrive at least 15 minutes before your scheduled appointment time.    If you have a lab appointment with the Cancer Center please come in thru the  Main Entrance and check in at the main information desk  You need to re-schedule your appointment should you arrive 10 or more minutes late.  We strive to give you quality time with our providers, and arriving late affects you and other patients whose appointments are after yours.  Also, if you no show three or more times for appointments you may be dismissed from the clinic at the providers discretion.     Again, thank you for choosing [facility].  Our hope is that these requests will decrease the amount of time that you wait before being seen by our physicians.       _____________________________________________________________  Should you have questions after your visit to  Cancer Center, please contact our office at [phone] between the hours of 8:30 a.m. and 4:30 p.m.  Voicemails left after 4:30 p.m. will not be returned until the following business day.  For prescription refill requests, have your pharmacy contact our office.       Resources For Cancer Patients and their Caregivers ? American Cancer Society: Can assist with transportation, wigs, general needs, runs Look Good Feel Better.        1-888-227-6333 ? Cancer Care: Provides financial assistance, online support groups, medication/co-pay assistance.  1-800-813-HOPE (4673) ? [facility] Assists [place] cancer patients and their families through emotional  , educational and financial support.  336-427-4357 ? [place] DSS Where to apply for food stamps, Medicaid and utility assistance. 336-342-1394 ? RCATS: Transportation to medical appointments. 336-347-2287 ? Social Security Administration: May apply for disability if have a Stage IV cancer. 336-342-7796 1-800-772-1213 ? [place] Aging, Disability and Transit Services: Assists with nutrition, care and transit needs. 336-349-2343  Cancer Center Support Programs: @10RELATIVEDAYS@ > Cancer Support Group  2nd Tuesday of the month 1pm-2pm, Journey Room  > Creative Journey  3rd Tuesday of the month 1130am-1pm, Journey Room  > Look Good Feel Better  1st Wednesday of the month 10am-12 noon, Journey Room (Call American Cancer Society to register 1-800-395-5775)    

## 2016-08-14 NOTE — Progress Notes (Signed)
Patient BP elevated, but asymptomatic and states that she took her BP medication late today.  Patient tolerated infusion well.  VSS.  BP back to baseline and WNL.  She states that she was nervous when she came in as well.  Patient and daughter educated on Aranesp injection and it's purpose.  She is to return next week for that and they are both aware of follow up appointments.  Patient wheeled out in wheelchair per preference and stable upon discharge from clinic.

## 2016-08-15 ENCOUNTER — Telehealth (HOSPITAL_COMMUNITY): Payer: Self-pay | Admitting: Hematology & Oncology

## 2016-08-15 ENCOUNTER — Other Ambulatory Visit (HOSPITAL_COMMUNITY): Payer: Self-pay | Admitting: *Deleted

## 2016-08-15 DIAGNOSIS — E611 Iron deficiency: Secondary | ICD-10-CM

## 2016-08-18 ENCOUNTER — Encounter (HOSPITAL_COMMUNITY): Payer: Medicare Other

## 2016-08-18 ENCOUNTER — Encounter (HOSPITAL_BASED_OUTPATIENT_CLINIC_OR_DEPARTMENT_OTHER): Payer: Medicare Other | Admitting: Oncology

## 2016-08-18 ENCOUNTER — Other Ambulatory Visit (HOSPITAL_COMMUNITY): Payer: Self-pay | Admitting: *Deleted

## 2016-08-18 ENCOUNTER — Encounter (HOSPITAL_BASED_OUTPATIENT_CLINIC_OR_DEPARTMENT_OTHER): Payer: Medicare Other

## 2016-08-18 ENCOUNTER — Encounter (HOSPITAL_COMMUNITY): Payer: Self-pay | Admitting: Oncology

## 2016-08-18 VITALS — BP 110/89 | HR 66 | Temp 98.4°F | Resp 20 | Wt 194.7 lb

## 2016-08-18 DIAGNOSIS — N183 Chronic kidney disease, stage 3 unspecified: Secondary | ICD-10-CM

## 2016-08-18 DIAGNOSIS — D631 Anemia in chronic kidney disease: Secondary | ICD-10-CM

## 2016-08-18 DIAGNOSIS — E210 Primary hyperparathyroidism: Secondary | ICD-10-CM | POA: Diagnosis not present

## 2016-08-18 DIAGNOSIS — Z86718 Personal history of other venous thrombosis and embolism: Secondary | ICD-10-CM | POA: Diagnosis not present

## 2016-08-18 DIAGNOSIS — E611 Iron deficiency: Secondary | ICD-10-CM

## 2016-08-18 DIAGNOSIS — E890 Postprocedural hypothyroidism: Secondary | ICD-10-CM | POA: Diagnosis not present

## 2016-08-18 DIAGNOSIS — N189 Chronic kidney disease, unspecified: Secondary | ICD-10-CM

## 2016-08-18 DIAGNOSIS — D649 Anemia, unspecified: Secondary | ICD-10-CM | POA: Diagnosis present

## 2016-08-18 DIAGNOSIS — D6851 Activated protein C resistance: Secondary | ICD-10-CM | POA: Diagnosis not present

## 2016-08-18 DIAGNOSIS — Z9889 Other specified postprocedural states: Secondary | ICD-10-CM | POA: Diagnosis not present

## 2016-08-18 DIAGNOSIS — K921 Melena: Secondary | ICD-10-CM | POA: Diagnosis not present

## 2016-08-18 DIAGNOSIS — I129 Hypertensive chronic kidney disease with stage 1 through stage 4 chronic kidney disease, or unspecified chronic kidney disease: Secondary | ICD-10-CM | POA: Diagnosis not present

## 2016-08-18 DIAGNOSIS — Z79899 Other long term (current) drug therapy: Secondary | ICD-10-CM | POA: Diagnosis not present

## 2016-08-18 DIAGNOSIS — E559 Vitamin D deficiency, unspecified: Secondary | ICD-10-CM | POA: Diagnosis not present

## 2016-08-18 DIAGNOSIS — M8580 Other specified disorders of bone density and structure, unspecified site: Secondary | ICD-10-CM | POA: Diagnosis not present

## 2016-08-18 DIAGNOSIS — N63 Unspecified lump in unspecified breast: Secondary | ICD-10-CM | POA: Diagnosis not present

## 2016-08-18 DIAGNOSIS — G4733 Obstructive sleep apnea (adult) (pediatric): Secondary | ICD-10-CM | POA: Diagnosis not present

## 2016-08-18 DIAGNOSIS — N184 Chronic kidney disease, stage 4 (severe): Secondary | ICD-10-CM | POA: Diagnosis not present

## 2016-08-18 DIAGNOSIS — E1122 Type 2 diabetes mellitus with diabetic chronic kidney disease: Secondary | ICD-10-CM | POA: Diagnosis not present

## 2016-08-18 DIAGNOSIS — Z880 Allergy status to penicillin: Secondary | ICD-10-CM | POA: Diagnosis not present

## 2016-08-18 DIAGNOSIS — D509 Iron deficiency anemia, unspecified: Secondary | ICD-10-CM | POA: Diagnosis not present

## 2016-08-18 HISTORY — DX: Activated protein C resistance: D68.51

## 2016-08-18 LAB — BASIC METABOLIC PANEL
Anion gap: 9 (ref 5–15)
BUN: 20 mg/dL (ref 6–20)
CALCIUM: 8.8 mg/dL — AB (ref 8.9–10.3)
CO2: 24 mmol/L (ref 22–32)
CREATININE: 1.53 mg/dL — AB (ref 0.44–1.00)
Chloride: 106 mmol/L (ref 101–111)
GFR calc non Af Amer: 30 mL/min — ABNORMAL LOW (ref >60)
GFR, EST AFRICAN AMERICAN: 35 mL/min — AB (ref >60)
Glucose, Bld: 151 mg/dL — ABNORMAL HIGH (ref 65–99)
Potassium: 3.9 mmol/L (ref 3.5–5.1)
SODIUM: 139 mmol/L (ref 135–145)

## 2016-08-18 LAB — CBC WITH DIFFERENTIAL/PLATELET
BASOS PCT: 1 %
Basophils Absolute: 0 10*3/uL (ref 0.0–0.1)
EOS ABS: 0.1 10*3/uL (ref 0.0–0.7)
EOS PCT: 3 %
HCT: 30.6 % — ABNORMAL LOW (ref 36.0–46.0)
Hemoglobin: 9.5 g/dL — ABNORMAL LOW (ref 12.0–15.0)
LYMPHS ABS: 1.4 10*3/uL (ref 0.7–4.0)
Lymphocytes Relative: 29 %
MCH: 25.5 pg — AB (ref 26.0–34.0)
MCHC: 31 g/dL (ref 30.0–36.0)
MCV: 82 fL (ref 78.0–100.0)
MONO ABS: 0.5 10*3/uL (ref 0.1–1.0)
MONOS PCT: 9 %
Neutro Abs: 2.9 10*3/uL (ref 1.7–7.7)
Neutrophils Relative %: 58 %
Platelets: 177 10*3/uL (ref 150–400)
RBC: 3.73 MIL/uL — ABNORMAL LOW (ref 3.87–5.11)
RDW: 22.3 % — AB (ref 11.5–15.5)
WBC: 4.9 10*3/uL (ref 4.0–10.5)

## 2016-08-18 LAB — IRON AND TIBC
Iron: 43 ug/dL (ref 28–170)
Saturation Ratios: 11 % (ref 10.4–31.8)
TIBC: 377 ug/dL (ref 250–450)
UIBC: 334 ug/dL

## 2016-08-18 LAB — FERRITIN: FERRITIN: 92 ng/mL (ref 11–307)

## 2016-08-18 LAB — SAMPLE TO BLOOD BANK

## 2016-08-18 MED ORDER — DARBEPOETIN ALFA 60 MCG/0.3ML IJ SOSY
60.0000 ug | PREFILLED_SYRINGE | Freq: Once | INTRAMUSCULAR | Status: DC
  Administered 2016-08-18: 60 ug via SUBCUTANEOUS
  Filled 2016-08-18: qty 0.3

## 2016-08-18 NOTE — Patient Instructions (Addendum)
[facility] at [facility] Discharge Instructions  RECOMMENDATIONS MADE BY THE CONSULTANT AND ANY TEST RESULTS WILL BE SENT TO YOUR REFERRING PHYSICIAN.  Aranesp today and in 2 weeks    Thank you for choosing [facility] at [facility] to provide your oncology and hematology care.  To afford each patient quality time with our provider, please arrive at least 15 minutes before your scheduled appointment time.    If you have a lab appointment with the Cancer Center please come in thru the  Main Entrance and check in at the main information desk  You need to re-schedule your appointment should you arrive 10 or more minutes late.  We strive to give you quality time with our providers, and arriving late affects you and other patients whose appointments are after yours.  Also, if you no show three or more times for appointments you may be dismissed from the clinic at the providers discretion.     Again, thank you for choosing [facility].  Our hope is that these requests will decrease the amount of time that you wait before being seen by our physicians.       _____________________________________________________________  Should you have questions after your visit to [facility], please contact our office at [phone] between the hours of 8:30 a.m. and 4:30 p.m.  Voicemails left after 4:30 p.m. will not be returned until the following business day.  For prescription refill requests, have your pharmacy contact our office.       Resources For Cancer Patients and their Caregivers ? American Cancer Society: Can assist with transportation, wigs, general needs, runs Look Good Feel Better.        [phone] ? Cancer Care: Provides financial assistance, online support groups, medication/co-pay assistance.  1-800-813-HOPE 616-337-0887(4673) ? [facility] Assists [place] cancer patients and their families  through emotional , educational and financial support.  [phone]([phone] ? [place] DSS Where to apply for food stamps, Medicaid and utility assistance. 430-145-9061440 [phone] ? RCATS: Transportation to medical appointments. 8507760745(630)730-7497 ? Social Security Administration: May apply for disability if have a Stage IV cancer. [phone] ? [place] Aging, Disability and Transit Services: Assists with nutrition, care and transit needs. [phone]  Cancer Center Support Programs: @10RELATIVEDAYS @ > Cancer Support Group  2nd Tuesday of the month 1pm-2pm, Journey Room  > Creative Journey  3rd Tuesday of the month 1130am-1pm, Journey Room  > Look Good Feel Better  1st Wednesday of the month 10am-12 noon, Journey Room (Call American Cancer Society to register [phone])

## 2016-08-18 NOTE — Progress Notes (Addendum)
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  INTERVAL HISTORY: Vickie OhLaverne Willis 83 y.o. female returns for followup of anemia of chronic renal disease, stage IV with an element of iron deficiency anemia requiring IV iron replacement therapy. AND Heterozygote for R506Q mutation, heterozygous factor V leiden.  She is here today to review her blood work.    She notes chronic arthritic pain in multiple joints.  She is using a lidocaine patch for discomfort.  Review of Systems  Constitutional: Negative.  Negative for chills, fever and weight loss.  HENT: Negative.   Eyes: Negative.   Respiratory: Negative.  Negative for cough.   Cardiovascular: Negative.  Negative for chest pain.  Gastrointestinal: Negative.  Negative for constipation, diarrhea, nausea and vomiting.  Genitourinary: Negative.  Negative for dysuria.  Musculoskeletal: Negative.   Skin: Negative.   Neurological: Negative.  Negative for weakness.  Endo/Heme/Allergies: Negative.   Psychiatric/Behavioral: Negative.     Past Medical History:  Diagnosis Date  . Diabetes mellitus without complication (HCC)   . 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 Hypertension   . Iron deficiency 08/14/2016  . Renal disorder     Past Surgical History:  Procedure Laterality Date  . PARATHYROIDECTOMY / EXPLORATION OF PARATHYROIDS    . STENT PLACEMENT VASCULAR ([facility] HX)      No family history on file.  Social History   Social History  . Marital status: Widowed    Spouse name: N/A  . Number of children: N/A  . Years of education: N/A   Social History Main Topics  .  Smoking status: Never Smoker  . Smokeless tobacco: Never Used  . Alcohol use No  . Drug use: No  . Sexual activity: Not on file   Other Topics Concern  . Not on file   Social History Narrative  . No narrative on file     PHYSICAL EXAMINATION  ECOG PERFORMANCE STATUS: 2 - Symptomatic, <50% confined to bed  Vitals:   08/18/16 0947  BP: 110/89  Pulse: 66  Resp: 20  Temp: 98.4 F (36.9 C)    GENERAL:alert, no distress, well nourished, well developed, comfortable, cooperative, obese, smiling and accompanied by daughter. SKIN: skin color, texture, turgor are normal, no rashes or significant lesions HEAD: Normocephalic, No masses, lesions, tenderness or abnormalities EYES: normal, EOMI, Conjunctiva are pink and non-injected EARS: External ears normal OROPHARYNX:lips, buccal mucosa, and tongue normal and mucous membranes are moist  NECK: supple, trachea midline LYMPH:  no palpable lymphadenopathy BREAST:not examined LUNGS: not examined HEART: not examined ABDOMEN:obese BACK: Back symmetric, no curvature. EXTREMITIES:less then 2 second capillary refill, no skin discoloration, no cyanosis  NEURO: alert & oriented x 3 with fluent speech, no focal motor/sensory deficits   LABORATORY DATA: CBC    Component Value Date/Time   WBC 4.9 08/18/2016 0840   RBC 3.73 (L) 08/18/2016 0840   HGB 9.5 (L) 08/18/2016 0840   HCT 30.6 (L) 08/18/2016 0840   HCT 27.0 (L) 06/18/2016 1353   PLT 177 08/18/2016 0840   PLT 329 06/18/2016 1353   MCV 82.0 08/18/2016 0840   MCV 74 (L)  06/18/2016 1353   MCH 25.5 (L) 08/18/2016 0840   MCHC 31.0 08/18/2016 0840   RDW 22.3 (H) 08/18/2016 0840   RDW 17.5 (H) 06/18/2016 1353   LYMPHSABS 1.4 08/18/2016 0840   LYMPHSABS 1.1 06/18/2016 1353   MONOABS 0.5 08/18/2016 0840   EOSABS 0.1 08/18/2016 0840   EOSABS 0.0 06/18/2016 1353   BASOSABS 0.0 08/18/2016 0840   BASOSABS 0.1 06/18/2016 1353      Chemistry      Component Value Date/Time   NA 139  08/18/2016 0840   K 3.9 08/18/2016 0840   CL 106 08/18/2016 0840   CO2 24 08/18/2016 0840   BUN 20 08/18/2016 0840   CREATININE 1.53 (H) 08/18/2016 0840      Component Value Date/Time   CALCIUM 8.8 (L) 08/18/2016 0840   ALKPHOS 62 07/29/2016 1557   AST 24 07/29/2016 1557   ALT 13 (L) 07/29/2016 1557   BILITOT 0.3 07/29/2016 1557     Lab Results  Component Value Date   IRON 43 08/18/2016   TIBC 377 08/18/2016   FERRITIN 92 08/18/2016   Lab Results  Component Value Date   VITAMINB12 856 07/29/2016   Lab Results  Component Value Date   FOLATE 16.3 07/29/2016   Lab Results  Component Value Date   RETICCTPCT 1.0 07/29/2016   . Lab Results  Component Value Date   PROT 6.5 07/29/2016   ALBUMINELP 2.7 (L) 07/29/2016   A1GS 0.3 07/29/2016   A2GS 1.0 07/29/2016   BETS 1.1 07/29/2016   GAMS 0.9 07/29/2016   MSPIKE Not Observed 07/29/2016   SPEI Comment 07/29/2016   SPECOM Comment 07/29/2016   IGGSERUM 814 07/29/2016   IGA 308 07/29/2016   IGMSERUM 51 07/29/2016   KPAFRELGTCHN 57.2 (H) 07/29/2016   LAMBDASER 56.0 (H) 07/29/2016   KAPLAMBRATIO 1.02 07/29/2016    PENDING LABS:   RADIOGRAPHIC STUDIES:  No results found.   PATHOLOGY:    ASSESSMENT AND PLAN:  Anemia in chronic kidney disease Anemia of chronic renal disease, stage III-IV with an element of iron deficiency anemia requiring IV iron replacement therapy in Jan 2018.  Labs today: CBC diff, BMET, iron/TIBC, ferritin.  I personally reviewed and went over laboratory results with the patient.  The results are noted within this dictation.  Iron studies performed in the event that insurance requires corrected iron values at time of start for ESA therapy.  She is provided education regarding Aranesp therapy and its role in her anemia care.  I reviewed the risks, benefits, alternatives, and side effects of Aranesp including, but not limited to, HTN and increased risk for VTE.  Labs every 2 weeks: CBC  diff.  Labs every 90 days: CMET, iron/TIBC, ferritin.  She is moving back to [place] in Feb 2018.  I have composed a letter and faxed records to [facility], CNP.  Letter can be reviewed in [facility].  Patient will need ongoing anemia management and follow-up.  No return follow-up at this time as she is moving back to [place].  Factor V Leiden carrier (HCC) Factor V leiden heterozygosity (Single R506Q mutation).  On Plavix.  Factor V leiden heterozygosity itself is not a strong thrombophilia.   ORDERS PLACED FOR THIS ENCOUNTER: Orders Placed This Encounter  Procedures  . Basic metabolic panel  . Iron and TIBC  . Ferritin  . Comprehensive metabolic panel  . Ferritin  . Iron and TIBC    MEDICATIONS PRESCRIBED THIS ENCOUNTER: Meds ordered this encounter  Medications  .  lidocaine (XYLOCAINE) 5 % ointment    Sig: Apply 1 application topically daily as needed.  Marland Kitchen MISC [employer] PO    Sig: Take by mouth. Sweet dreams. 1 tab nightly.    THERAPY PLAN:  Aranesp at renal dosing every 2 weeks.  Will adjust dose according to response.  All questions were answered. The patient knows to call the clinic with any problems, questions or concerns. We can certainly see the patient much sooner if necessary.  Patient and plan discussed with Dr. Loma Messing and she is in agreement with the aforementioned.   This note is electronically signed by: Tina Griffiths 08/18/2016 3:59 PM

## 2016-08-18 NOTE — Assessment & Plan Note (Addendum)
Factor V leiden heterozygosity (Single R506Q mutation).  On Plavix.  Factor V leiden heterozygosity itself is not a strong thrombophilia.

## 2016-08-18 NOTE — Assessment & Plan Note (Addendum)
Anemia of chronic renal disease, stage III-IV with an element of iron deficiency anemia requiring IV iron replacement therapy in Jan 2018.  Labs today: CBC diff, BMET, iron/TIBC, ferritin.  I personally reviewed and went over laboratory results with the patient.  The results are noted within this dictation.  Iron studies performed in the event that insurance requires corrected iron values at time of start for ESA therapy.  She is provided education regarding Aranesp therapy and its role in her anemia care.  I reviewed the risks, benefits, alternatives, and side effects of Aranesp including, but not limited to, HTN and increased risk for VTE.  Labs every 2 weeks: CBC diff.  Labs every 90 days: CMET, iron/TIBC, ferritin.  [place] is moving back to [place] in Feb 2018.  I have composed a letter and [facility].  Letter can be reviewed in [facility].  Patient will need ongoing anemia management and follow-up.  No return follow-up at this time as she is moving back to [place].

## 2016-08-18 NOTE — Patient Instructions (Signed)
[facility] at [facility] Discharge Instructions  RECOMMENDATIONS MADE BY THE CONSULTANT AND ANY TEST RESULTS WILL BE SENT TO YOUR REFERRING PHYSICIAN.  Received Aranesp injection today. Follow-up as scheduled. Call clinic for any questions or concerns  Thank you for choosing [facility] at [facility] to provide your oncology and hematology care.  To afford each patient quality time with our provider, please arrive at least 15 minutes before your scheduled appointment time.    If you have a lab appointment with the Cancer Center please come in thru the  Main Entrance and check in at the main information desk  You need to re-schedule your appointment should you arrive 10 or more minutes late.  We strive to give you quality time with our providers, and arriving late affects you and other patients whose appointments are after yours.  Also, if you no show three or more times for appointments you may be dismissed from the clinic at the providers discretion.     Again, thank you for choosing [facility].  Our hope is that these requests will decrease the amount of time that you wait before being seen by our physicians.       _____________________________________________________________  Should you have questions after your visit to  Cancer Center, please contact our office at [phone] between the hours of 8:30 a.m. and 4:30 p.m.  Voicemails left after 4:30 p.m. will not be returned until the following business day.  For prescription refill requests, have your pharmacy contact our office.       Resources For Cancer Patients and their Caregivers ? American Cancer Society: Can assist with transportation, wigs, general needs, runs Look Good Feel Better.        1-888-227-6333 ? Cancer Care: Provides financial assistance, online support groups, medication/co-pay assistance.  1-800-813-HOPE (4673) ? [facility] Assists [place] cancer patients and their families through emotional , educational and financial support.  336-427-4357 ? [place] DSS Where to apply for food stamps, Medicaid and utility assistance. 336-342-1394 ? RCATS: Transportation to medical appointments. 336-347-2287 ? Social Security Administration: May apply for disability if have a Stage IV cancer. 336-342-7796 1-800-772-1213 ? [place] Aging, Disability and Transit Services: Assists with nutrition, care and transit needs. 336-349-2343  Cancer Center Support Programs: @10RELATIVEDAYS@ > Cancer Support Group  2nd Tuesday of the month 1pm-2pm, Journey Room  > Creative Journey  3rd Tuesday of the month 1130am-1pm, Journey Room  > Look Good Feel Better  1st Wednesday of the month 10am-12 noon, Journey Room (Call American Cancer Society to register 1-800-395-5775)   

## 2016-08-18 NOTE — Progress Notes (Signed)
Vickie Willis tolerated Aranesp injection Hgb 9.5. Labs reviewed and pt and daughter spoke with PA prior to administering Aranesp.VSS Pt discharged self ambulatory in satisfactory condition accompanied by her daughter

## 2016-09-01 ENCOUNTER — Encounter (HOSPITAL_COMMUNITY): Payer: Self-pay

## 2016-09-01 ENCOUNTER — Encounter (HOSPITAL_BASED_OUTPATIENT_CLINIC_OR_DEPARTMENT_OTHER): Payer: Medicare Other

## 2016-09-01 ENCOUNTER — Encounter (HOSPITAL_COMMUNITY): Payer: Medicare Other

## 2016-09-01 VITALS — BP 153/63 | HR 80 | Temp 97.9°F | Resp 18

## 2016-09-01 DIAGNOSIS — D631 Anemia in chronic kidney disease: Secondary | ICD-10-CM

## 2016-09-01 DIAGNOSIS — N183 Chronic kidney disease, stage 3 unspecified: Secondary | ICD-10-CM

## 2016-09-01 DIAGNOSIS — D509 Iron deficiency anemia, unspecified: Secondary | ICD-10-CM | POA: Diagnosis not present

## 2016-09-01 DIAGNOSIS — E611 Iron deficiency: Secondary | ICD-10-CM

## 2016-09-01 LAB — CBC WITH DIFFERENTIAL/PLATELET
Basophils Absolute: 0 K/uL (ref 0.0–0.1)
Basophils Relative: 1 %
Eosinophils Absolute: 0.2 K/uL (ref 0.0–0.7)
Eosinophils Relative: 4 %
HCT: 31.2 % — ABNORMAL LOW (ref 36.0–46.0)
Hemoglobin: 10 g/dL — ABNORMAL LOW (ref 12.0–15.0)
Lymphocytes Relative: 29 %
Lymphs Abs: 1.4 K/uL (ref 0.7–4.0)
MCH: 25.8 pg — ABNORMAL LOW (ref 26.0–34.0)
MCHC: 32.1 g/dL (ref 30.0–36.0)
MCV: 80.6 fL (ref 78.0–100.0)
Monocytes Absolute: 0.4 K/uL (ref 0.1–1.0)
Monocytes Relative: 8 %
Neutro Abs: 2.9 K/uL (ref 1.7–7.7)
Neutrophils Relative %: 60 %
Platelets: 195 K/uL (ref 150–400)
RBC: 3.87 MIL/uL (ref 3.87–5.11)
RDW: 21 % — ABNORMAL HIGH (ref 11.5–15.5)
WBC: 4.8 K/uL (ref 4.0–10.5)

## 2016-09-01 MED ORDER — DARBEPOETIN ALFA 60 MCG/0.3ML IJ SOSY
60.0000 ug | PREFILLED_SYRINGE | Freq: Once | INTRAMUSCULAR | Status: AC
  Administered 2016-09-01: 60 ug via SUBCUTANEOUS
  Filled 2016-09-01: qty 0.3

## 2016-09-01 NOTE — Progress Notes (Signed)
Vickie Willis presents today for injection per the provider's orders.  Aranesp administration without incident; see MAR for injection details.  Patient tolerated procedure well and without incident.  No questions or complaints noted at this time.

## 2016-09-01 NOTE — Patient Instructions (Signed)
[facility] at [facility] Discharge Instructions  RECOMMENDATIONS MADE BY THE CONSULTANT AND ANY TEST RESULTS WILL BE SENT TO YOUR REFERRING PHYSICIAN.  Aranesp injection today. Return as scheduled.   Thank you for choosing [facility] at [facility] to provide your oncology and hematology care.  To afford each patient quality time with our provider, please arrive at least 15 minutes before your scheduled appointment time.    If you have a lab appointment with the Cancer Center please come in thru the  Main Entrance and check in at the main information desk  You need to re-schedule your appointment should you arrive 10 or more minutes late.  We strive to give you quality time with our providers, and arriving late affects you and other patients whose appointments are after yours.  Also, if you no show three or more times for appointments you may be dismissed from the clinic at the providers discretion.     Again, thank you for choosing [facility].  Our hope is that these requests will decrease the amount of time that you wait before being seen by our physicians.       _____________________________________________________________  Should you have questions after your visit to [facility], please contact our office at [phone] between the hours of 8:30 a.m. and 4:30 p.m.  Voicemails left after 4:30 p.m. will not be returned until the following business day.  For prescription refill requests, have your pharmacy contact our office.       Resources For Cancer Patients and their Caregivers ? American Cancer Society: Can assist with transportation, wigs, general needs, runs Look Good Feel Better.        1-888-227-6333 ? Cancer Care: Provides financial assistance, online support groups, medication/co-pay assistance.  1-800-813-HOPE (4673) ? [facility] Assists [place] cancer patients and  their families through emotional , educational and financial support.  336-427-4357 ? [place] DSS Where to apply for food stamps, Medicaid and utility assistance. 336-342-1394 ? RCATS: Transportation to medical appointments. 336-347-2287 ? Social Security Administration: May apply for disability if have a Stage IV cancer. 336-342-7796 1-800-772-1213 ? [place] Aging, Disability and Transit Services: Assists with nutrition, care and transit needs. 336-349-2343  Cancer Center Support Programs: @10RELATIVEDAYS@ > Cancer Support Group  2nd Tuesday of the month 1pm-2pm, Journey Room  > Creative Journey  3rd Tuesday of the month 1130am-1pm, Journey Room  > Look Good Feel Better  1st Wednesday of the month 10am-12 noon, Journey Room (Call American Cancer Society to register 1-800-395-5775)   

## 2016-09-03 ENCOUNTER — Other Ambulatory Visit: Payer: Self-pay | Admitting: Family Medicine

## 2016-09-03 ENCOUNTER — Telehealth: Payer: Self-pay | Admitting: Family Medicine

## 2016-09-03 MED ORDER — NIFEDIPINE ER OSMOTIC RELEASE 60 MG PO TB24: 60.0000 mg | ORAL_TABLET | Freq: Every day | ORAL | 1 refills | Status: AC

## 2016-09-03 NOTE — Telephone Encounter (Signed)
Needs [facility]ill of N[facility]edipine er[facility]

## 2017-07-23 ENCOUNTER — Encounter: Payer: Self-pay | Admitting: Family Medicine

## 2017-07-29 NOTE — Telephone Encounter (Signed)
Nurses-I need several things.  #1 the patient daughter states that she had lab work faxed to us.  Can we find this lab work?  #2 please talk with the daughter to find out in addition to thyroid testing what other testing is she interested in or just the standard testing that I feel the patient needs?  Please send me responses on this particular issue via phone message thank you

## 2017-08-27 IMAGING — DX DG CHEST 2V
2 series · 2 of 2 positions shown · non-contrast
Comparison: May 07, 2016

CLINICAL DATA: Tightness in chest.  Wheezing.

EXAM:
CHEST  2 VIEW

[chest pa]
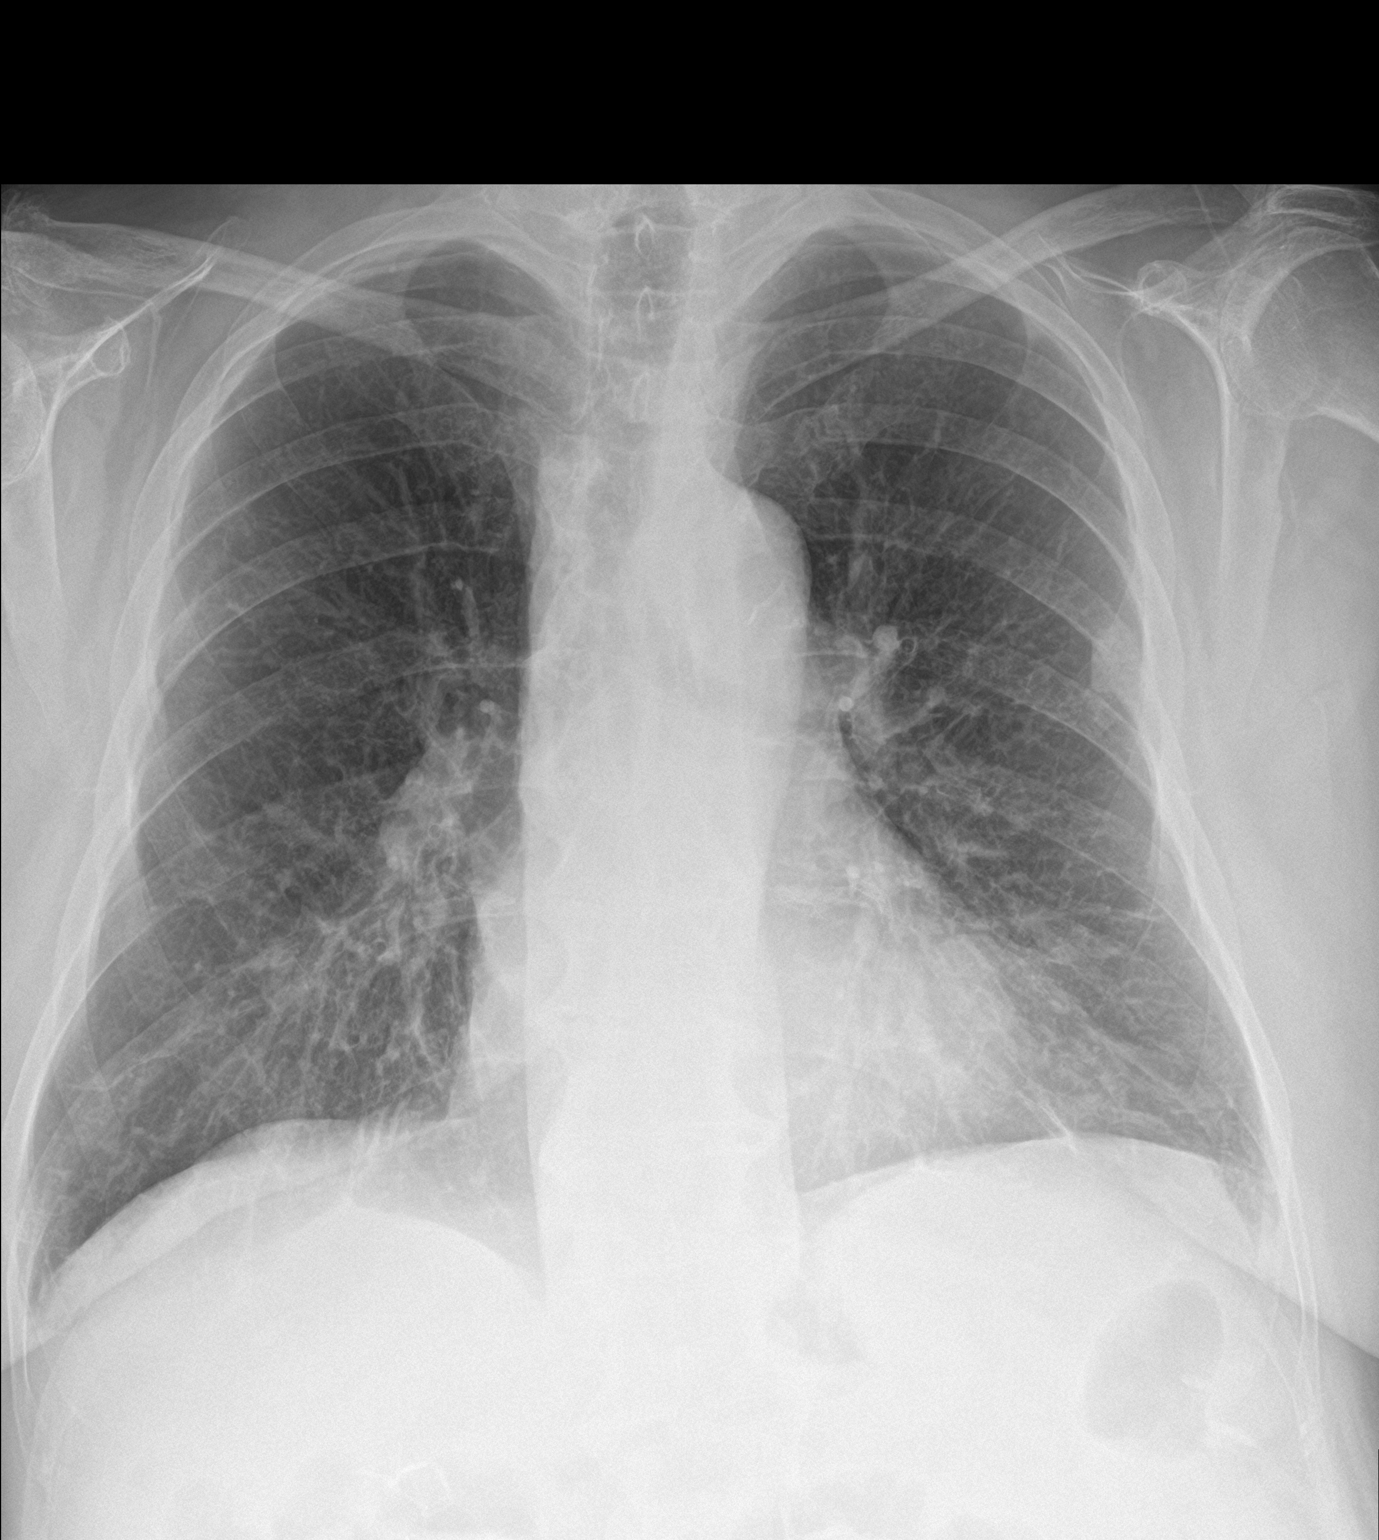

[chest lat]
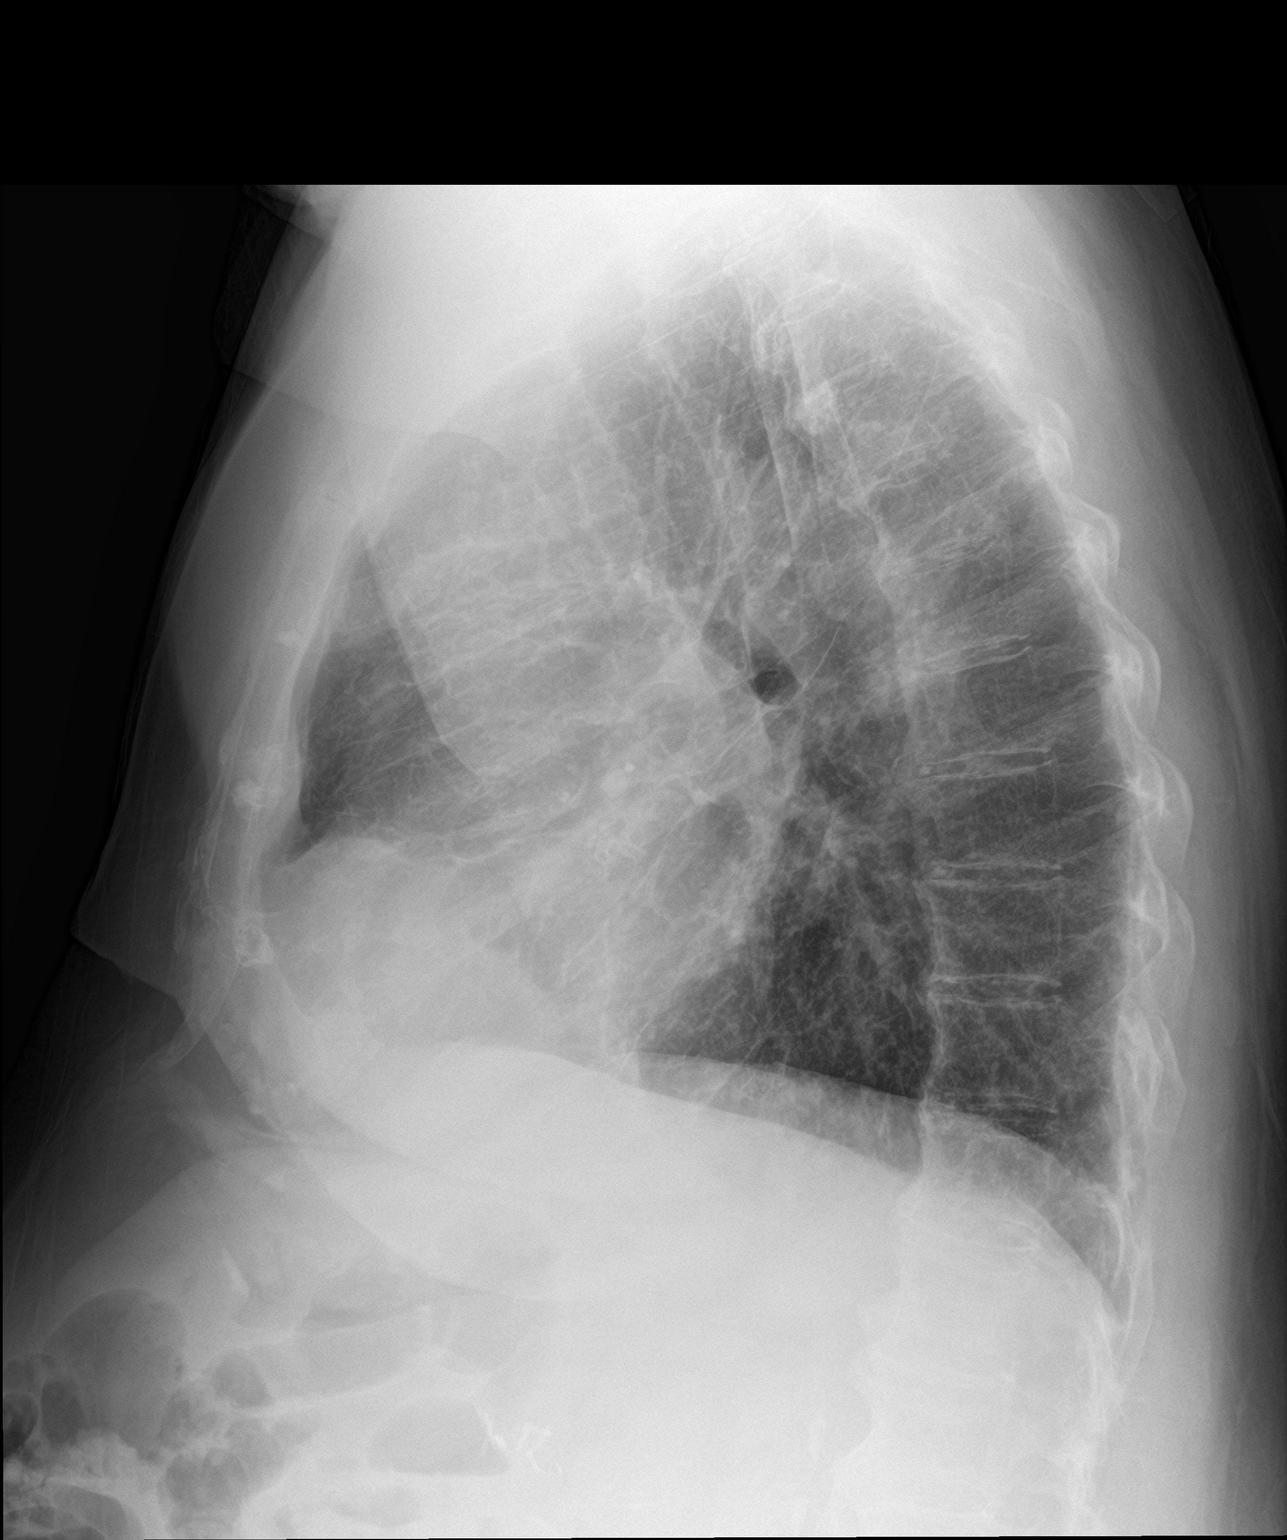

[2 of 2 positions shown; findings below may reference images not displayed]

FINDINGS: Callus formation at the left lateral rib fracture seen on the
previous study. Degenerative changes in the spine. No pneumothorax.
Small nodule measuring 5 mm in the right base, probably unchanged
given difference in positioning. Interstitial coarsening in the
bases with more focal opacity on the left, probably in the lingula
based on the lateral view. No other acute abnormalities identified.
IMPRESSION: 1. 5 mm nodule in the right lung. Recommend comparison to remote
outside films if available. Otherwise, recommend CT scan.
2. Bronchitic changes in the bases. More focal opacity in the
lingula may represent developing pneumonia. Recommend follow-up to
resolution.

## 2019-10-15 ENCOUNTER — Other Ambulatory Visit: Payer: Self-pay | Admitting: Nurse Practitioner

## 2020-11-09 DEATH — deceased
